# Patient Record
Sex: Female | Born: 1970 | Race: White | Hispanic: Yes | Marital: Single | Smoking: Never smoker
Health system: Southern US, Community
[De-identification: ages and names within clinical notes are randomized; demographics above are authoritative.]

## PROBLEM LIST (undated history)

## (undated) DIAGNOSIS — R059 Cough, unspecified: Secondary | ICD-10-CM

## (undated) DIAGNOSIS — Z9889 Other specified postprocedural states: Secondary | ICD-10-CM

## (undated) DIAGNOSIS — Z87442 Personal history of urinary calculi: Secondary | ICD-10-CM

## (undated) DIAGNOSIS — R05 Cough: Secondary | ICD-10-CM

## (undated) DIAGNOSIS — D649 Anemia, unspecified: Secondary | ICD-10-CM

## (undated) DIAGNOSIS — R112 Nausea with vomiting, unspecified: Secondary | ICD-10-CM

## (undated) DIAGNOSIS — I10 Essential (primary) hypertension: Secondary | ICD-10-CM

## (undated) HISTORY — PX: LEEP: SHX91

## (undated) HISTORY — PX: WISDOM TOOTH EXTRACTION: SHX21

---

## 2001-09-07 DIAGNOSIS — Z87442 Personal history of urinary calculi: Secondary | ICD-10-CM

## 2001-09-07 HISTORY — DX: Personal history of urinary calculi: Z87.442

## 2006-04-11 ENCOUNTER — Emergency Department (HOSPITAL_COMMUNITY): Admission: EM | Admit: 2006-04-11 | Discharge: 2006-04-11 | Payer: Self-pay | Admitting: Emergency Medicine

## 2006-04-15 ENCOUNTER — Ambulatory Visit (HOSPITAL_COMMUNITY): Admission: RE | Admit: 2006-04-15 | Discharge: 2006-04-15 | Payer: Self-pay | Admitting: Obstetrics and Gynecology

## 2008-08-13 ENCOUNTER — Emergency Department (HOSPITAL_COMMUNITY): Admission: EM | Admit: 2008-08-13 | Discharge: 2008-08-13 | Payer: Self-pay | Admitting: Emergency Medicine

## 2008-11-05 ENCOUNTER — Emergency Department (HOSPITAL_COMMUNITY): Admission: EM | Admit: 2008-11-05 | Discharge: 2008-11-05 | Payer: Self-pay | Admitting: *Deleted

## 2009-03-25 IMAGING — CT CT PELVIS W/O CM
2 of 4 series · 16 of 46 positions shown, 18 images · non-contrast
Comparison: None.

CT ABDOMEN

CLINICAL DATA: Pain with urination.  Right flank pain.  History of
stones.

CT ABDOMEN AND PELVIS WITHOUT CONTRAST
TECHNIQUE: Multidetector CT imaging of the abdomen and pelvis was
performed following the standard protocol without intravenous
contrast.

[Series 2: 160 stone 5.0 b40f · axial · 0.66mm/px · z∈[-410,-90]mm · 13 of 94 slices shown, 15 images]
[im 7/94  soft-tissue]
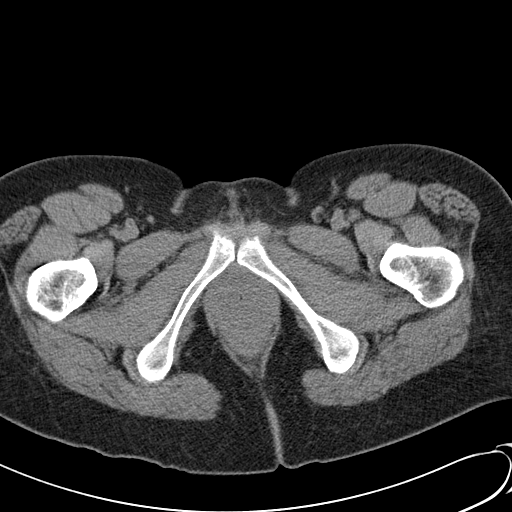
[im 7/94  bone]
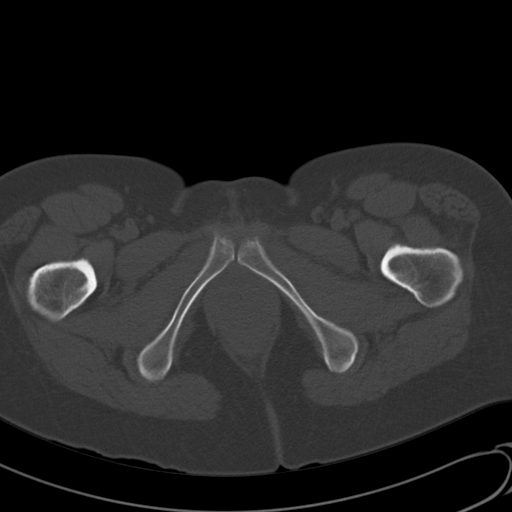
[im 14/94  soft-tissue]
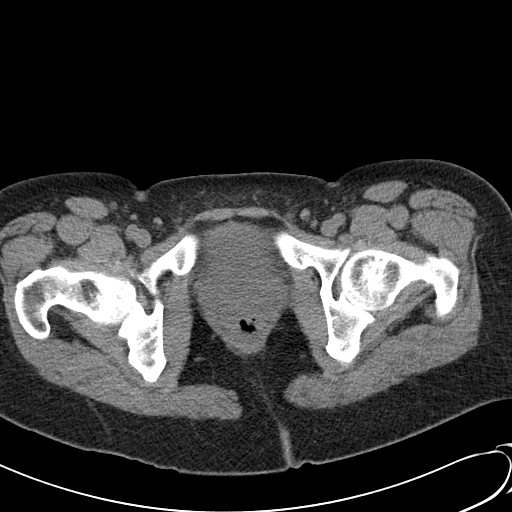
[im 20/94  soft-tissue]
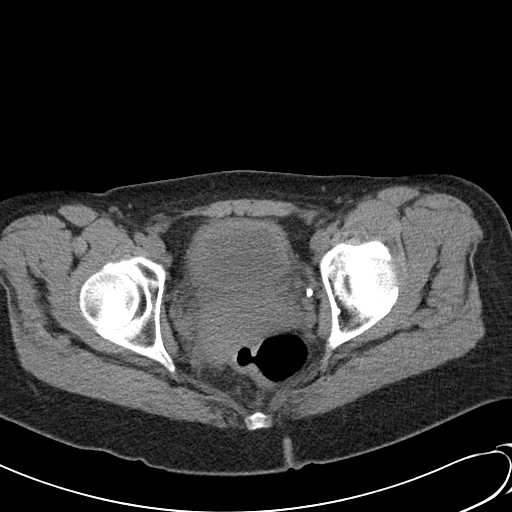
[im 27/94  soft-tissue]
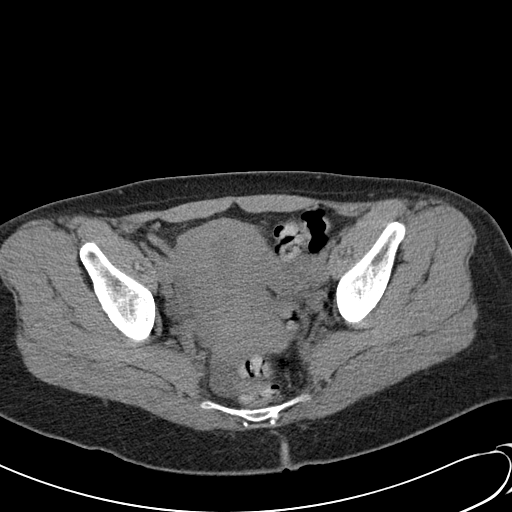
[im 34/94  soft-tissue]
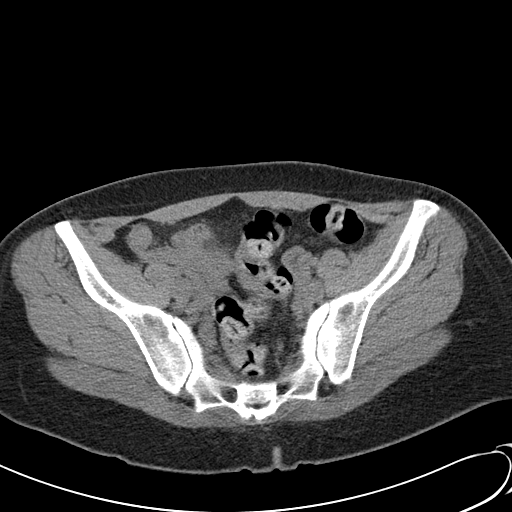
[im 40/94  soft-tissue]
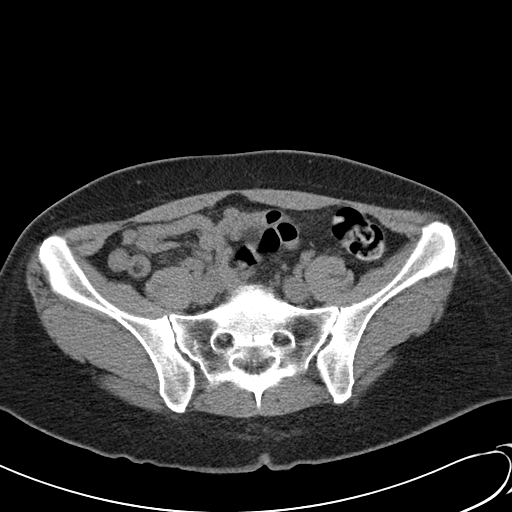
[im 47/94  soft-tissue]
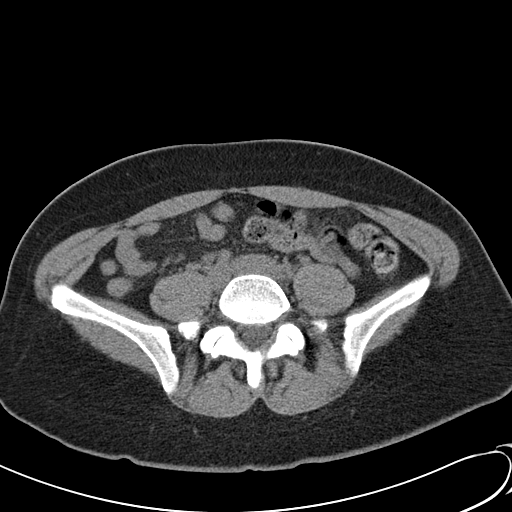
[im 54/94  soft-tissue]
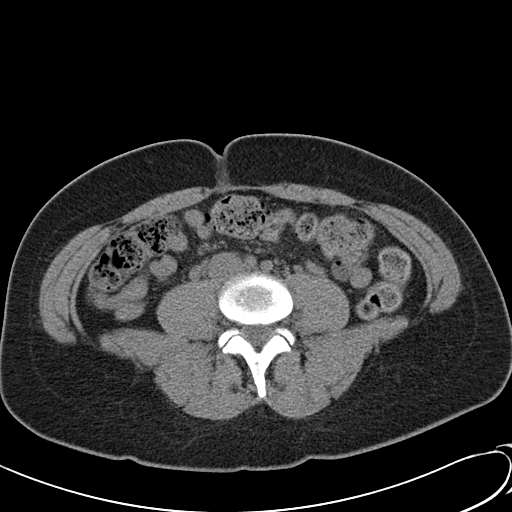
[im 60/94  soft-tissue]
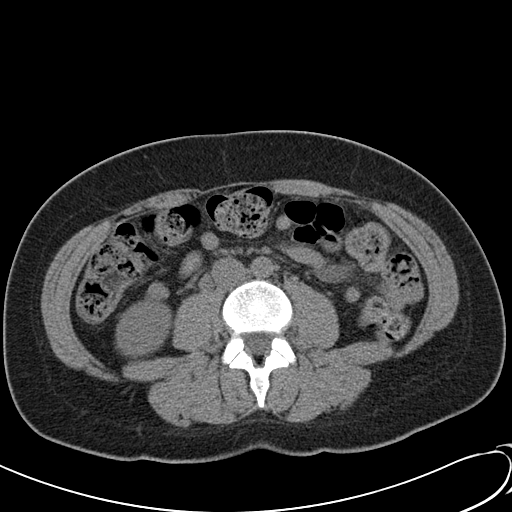
[im 60/94  bone]
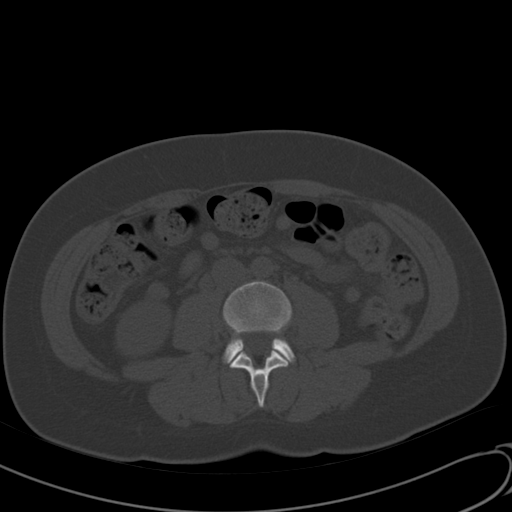
[im 67/94  soft-tissue]
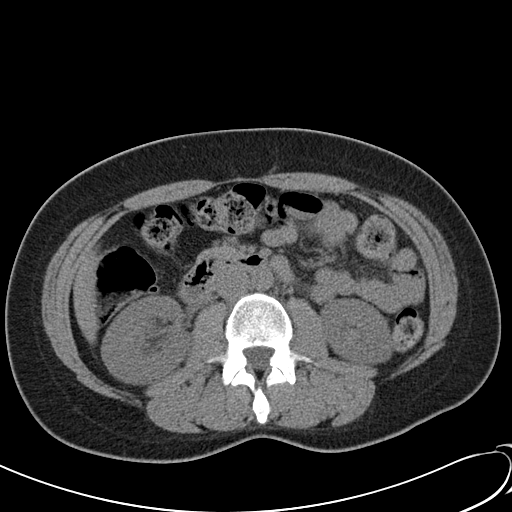
[im 74/94  soft-tissue]
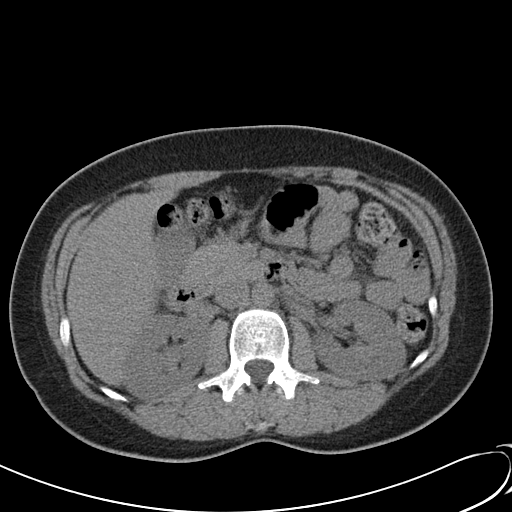
[im 80/94  soft-tissue]
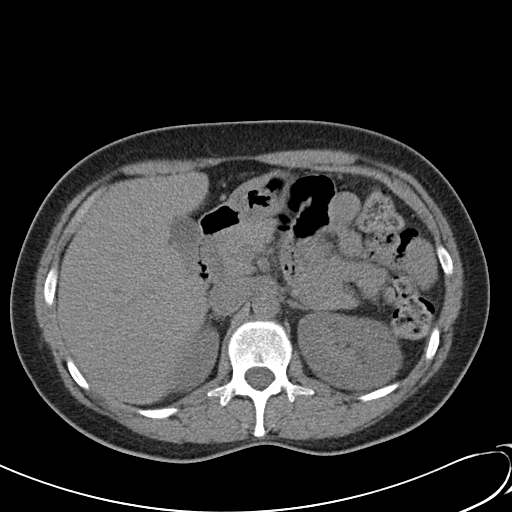
[im 87/94  soft-tissue]
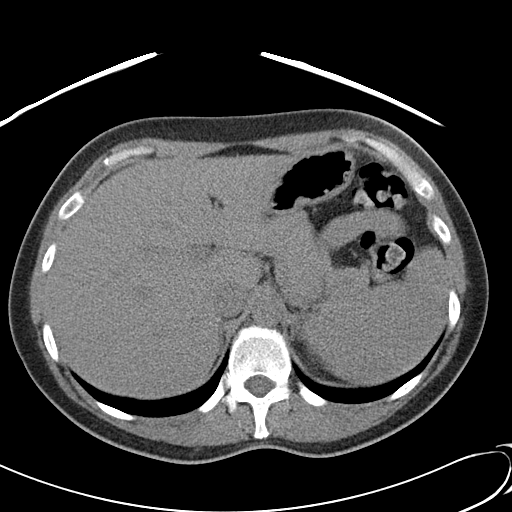

[Series 602: <mpr thick range> · coronal · 0.74mm/px · 3 of 69 slices shown]
[im 23/69  soft-tissue]
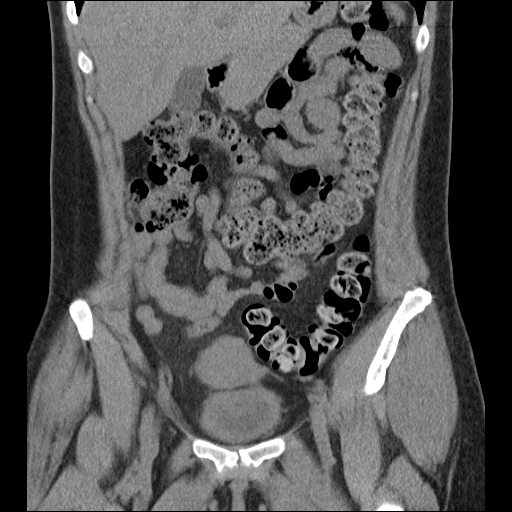
[im 31/69  soft-tissue]
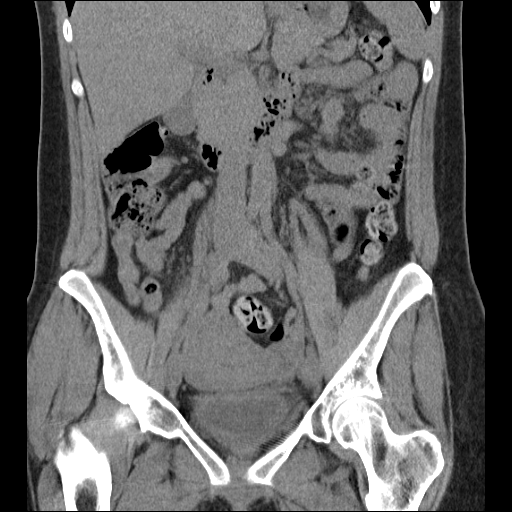
[im 38/69  soft-tissue]
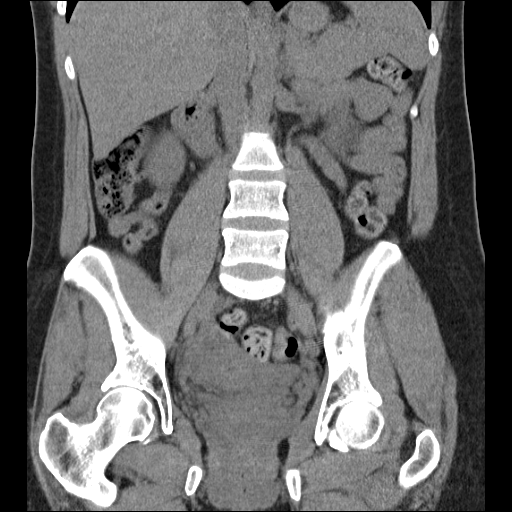

[16 of 46 positions shown; findings below may reference images not displayed]

FINDINGS: Lung bases clear.  Unenhanced visualized portions of the
liver and spleen unremarkable.

There is slight fullness of the right renal collecting system
without calcified obstructing stone identified.  Within lower pole
of the right kidney is a 4.7 mm hyperdensity which may represent a
renal calculus although a hyperdense primary renal lesion cannot be
excluded.  Stability can be confirmed on follow-up.  Nonobstructing
left renal calculi noted.  Adrenal glands unremarkable.  No
calcified gallstones.

Evaluation of bowel and pancreas is limited by lack of contrast and
fat planes without gross abnormality identified.  Of note is
superiorly located cecum with the appendix extending to just below
the liver without surrounding inflammation.  No abdominal aortic
aneurysm.
IMPRESSION: Slight fullness of the right renal collecting system without
radiopaque stone identified.  It is possible this is related to a
recently passed stone or nonradiopaque obstructing process.  Within
the lower pole of the right kidney is a radiopaque 4.7 mm structure
which may represent a stone or hyperdense lesion.  Stability can be
confirmed on follow-up.

Nonobstructing left renal calculi.

Superiorly located cecum and appendix without surrounding
inflammation.

CT PELVIS
FINDINGS: No ureteral calculi identified.  Partially decompressed
bladder with circumferentially thickened wall.  Small amount of
fluid within the right aspect of the pelvis may be related to
recent ovulation rather than bowel inflammation.
IMPRESSION: No ureteral calculi identified.

Small amount of fluid within the right aspect [DATE] be
related to recent ovulation rather than bowel inflammation.

## 2010-09-28 ENCOUNTER — Encounter: Payer: Self-pay | Admitting: Obstetrics and Gynecology

## 2010-12-18 LAB — URINALYSIS, ROUTINE W REFLEX MICROSCOPIC
Bilirubin Urine: NEGATIVE
Hgb urine dipstick: NEGATIVE
Specific Gravity, Urine: 1.024 (ref 1.005–1.030)
pH: 6 (ref 5.0–8.0)

## 2010-12-18 LAB — CBC
HCT: 33.7 % — ABNORMAL LOW (ref 36.0–46.0)
Hemoglobin: 11.1 g/dL — ABNORMAL LOW (ref 12.0–15.0)
MCV: 76.2 fL — ABNORMAL LOW (ref 78.0–100.0)
WBC: 3.8 10*3/uL — ABNORMAL LOW (ref 4.0–10.5)

## 2010-12-18 LAB — POCT PREGNANCY, URINE: Preg Test, Ur: NEGATIVE

## 2010-12-18 LAB — DIFFERENTIAL
Blasts: 0 %
Lymphocytes Relative: 35 % (ref 12–46)
Lymphs Abs: 1.3 10*3/uL (ref 0.7–4.0)
Myelocytes: 0 %
Neutro Abs: 2.1 10*3/uL (ref 1.7–7.7)
Promyelocytes Absolute: 0 %
nRBC: 0 /100 WBC

## 2010-12-18 LAB — POCT I-STAT, CHEM 8
BUN: 11 mg/dL (ref 6–23)
Creatinine, Ser: 0.9 mg/dL (ref 0.4–1.2)
Hemoglobin: 11.9 g/dL — ABNORMAL LOW (ref 12.0–15.0)
Potassium: 4 mEq/L (ref 3.5–5.1)
Sodium: 139 mEq/L (ref 135–145)

## 2011-01-09 ENCOUNTER — Emergency Department (HOSPITAL_COMMUNITY): Payer: Private Health Insurance - Indemnity

## 2011-01-09 ENCOUNTER — Emergency Department (HOSPITAL_COMMUNITY)
Admission: EM | Admit: 2011-01-09 | Discharge: 2011-01-09 | Disposition: A | Discharge status: Home / Self Care | Payer: Private Health Insurance - Indemnity | Attending: Emergency Medicine | Admitting: Emergency Medicine

## 2011-01-09 DIAGNOSIS — D649 Anemia, unspecified: Secondary | ICD-10-CM | POA: Insufficient documentation

## 2011-01-09 DIAGNOSIS — I10 Essential (primary) hypertension: Secondary | ICD-10-CM | POA: Insufficient documentation

## 2011-01-09 DIAGNOSIS — R0789 Other chest pain: Secondary | ICD-10-CM | POA: Insufficient documentation

## 2011-01-09 LAB — BASIC METABOLIC PANEL
CO2: 28 mEq/L (ref 19–32)
Calcium: 9.4 mg/dL (ref 8.4–10.5)
Creatinine, Ser: 0.6 mg/dL (ref 0.4–1.2)
GFR calc Af Amer: >60 mL/min (ref >60)
GFR calc non Af Amer: >60 mL/min (ref >60)
Sodium: 137 mEq/L (ref 135–145)

## 2011-01-09 LAB — POCT CARDIAC MARKERS
CKMB, poc: <1 ng/mL — ABNORMAL LOW (ref 1.0–8.0)
Myoglobin, poc: 57.5 ng/mL (ref 12–200)
Troponin i, poc: <0.05 ng/mL (ref 0.00–0.09)

## 2011-01-09 LAB — D-DIMER, QUANTITATIVE: D-Dimer, Quant: 0.46 ug/mL-FEU (ref 0.00–0.48)

## 2011-01-09 LAB — DIFFERENTIAL
Basophils Absolute: 0 10*3/uL (ref 0.0–0.1)
Basophils Relative: 1 % (ref 0–1)
Lymphocytes Relative: 32 % (ref 12–46)
Monocytes Absolute: 1.2 10*3/uL — ABNORMAL HIGH (ref 0.1–1.0)
Neutro Abs: 3.1 10*3/uL (ref 1.7–7.7)
Neutrophils Relative %: 48 % (ref 43–77)

## 2011-01-09 LAB — CBC
HCT: 34.1 % — ABNORMAL LOW (ref 36.0–46.0)
Hemoglobin: 10.9 g/dL — ABNORMAL LOW (ref 12.0–15.0)
MCHC: 32 g/dL (ref 30.0–36.0)
RBC: 4.53 MIL/uL (ref 3.87–5.11)

## 2011-01-09 LAB — HEPATIC FUNCTION PANEL
Albumin: 3.7 g/dL (ref 3.5–5.2)
Alkaline Phosphatase: 67 U/L (ref 39–117)
Indirect Bilirubin: 0.3 mg/dL (ref 0.3–0.9)
Total Protein: 7.4 g/dL (ref 6.0–8.3)

## 2011-06-12 LAB — URINALYSIS, ROUTINE W REFLEX MICROSCOPIC
Glucose, UA: NEGATIVE mg/dL
Ketones, ur: NEGATIVE mg/dL
Nitrite: POSITIVE — AB
Specific Gravity, Urine: 1.021 (ref 1.005–1.030)
pH: 6 (ref 5.0–8.0)

## 2011-06-12 LAB — URINE MICROSCOPIC-ADD ON

## 2011-09-23 ENCOUNTER — Emergency Department (HOSPITAL_COMMUNITY)
Admission: EM | Admit: 2011-09-23 | Discharge: 2011-09-23 | Disposition: A | Discharge status: Home / Self Care | Payer: Private Health Insurance - Indemnity | Attending: Emergency Medicine | Admitting: Emergency Medicine

## 2011-09-23 ENCOUNTER — Encounter (HOSPITAL_COMMUNITY): Payer: Self-pay | Admitting: *Deleted

## 2011-09-23 DIAGNOSIS — R42 Dizziness and giddiness: Secondary | ICD-10-CM | POA: Insufficient documentation

## 2011-09-23 DIAGNOSIS — I10 Essential (primary) hypertension: Secondary | ICD-10-CM | POA: Insufficient documentation

## 2011-09-23 DIAGNOSIS — Z79899 Other long term (current) drug therapy: Secondary | ICD-10-CM | POA: Insufficient documentation

## 2011-09-23 DIAGNOSIS — R55 Syncope and collapse: Secondary | ICD-10-CM | POA: Insufficient documentation

## 2011-09-23 NOTE — ED Provider Notes (Signed)
History     CSN: [id number]  Arrival date & time 09/23/11  1757   First MD Initiated Contact with Patient 09/23/11 2017      Chief Complaint  Patient presents with  . Medication Reaction    [facility] and being started on HCTZ due to BP being borderline. pt states since starting medication she has been experiencing dizziness, SOB, and HA.     (Consider location/radiation/quality/duration/timing/severity/associated sxs/prior treatment) HPI Meleni Delahunt is a 41 y.o. female presents with c/o dizziness and near-syncope leading to desire to be assessed in the ED. The sx(s) have been present for 3 days. Additional concerns are frequent urination. Causative factors are starting on HCTZ 3 days ago. Palliative factors are rest. The distress associated is mild. The disorder has been present for 3 days. History reviewed. No pertinent past medical history.  History reviewed. No pertinent past surgical history.  History reviewed. No pertinent family history.  History  Substance Use Topics  . Smoking status: Never Smoker   . Smokeless tobacco: Not on file  . Alcohol Use: No    OB History    Grav Para Term Preterm Abortions TAB SAB Ect Mult Living                  Review of Systems  All other systems reviewed and are negative.    Allergies  Review of patient's allergies indicates no known allergies.  Home Medications   Current Outpatient Rx  Name Route Sig Dispense Refill  . ACETAMINOPHEN 325 MG PO TABS Oral Take 650 mg by mouth every 6 (six) hours as needed.    Marland Kitchen HYDROCHLOROTHIAZIDE 25 MG PO TABS Oral Take 25 mg by mouth daily.    . IBANDRONATE SODIUM 150 MG PO TABS Oral Take 150 mg by mouth every 30 (thirty) days. Take in the morning with a full glass of water, on an empty stomach, and do not take anything else by mouth or lie down for the next 30 min.    . IBUPROFEN 200 MG PO TABS Oral Take 400 mg by mouth every 6 (six) hours as needed.    Marland Kitchen  METRONIDAZOLE 500 MG PO TABS Oral Take 500 mg by mouth 2 (two) times daily.      BP 138/102  Pulse 87  Temp(Src) 98.4 F (36.9 C) (Oral)  Resp 20  SpO2 100%  LMP 09/07/2011  Physical Exam  Nursing note and vitals reviewed. Constitutional: She is oriented to person, place, and time. She appears well-developed and well-nourished.  HENT:  Head: Normocephalic and atraumatic.  Eyes: Conjunctivae and EOM are normal. Pupils are equal, round, and reactive to light.  Neck: Normal range of motion and phonation normal. Neck supple.  Cardiovascular: Normal rate, regular rhythm and intact distal pulses.   Pulmonary/Chest: Effort normal and breath sounds normal. She exhibits no tenderness.  Abdominal: Soft.  Musculoskeletal: Normal range of motion.  Neurological: She is alert and oriented to person, place, and time. She has normal strength and normal reflexes. She exhibits normal muscle tone.       Normal gait  Skin: Skin is warm and dry.  Psychiatric: She has a normal mood and affect. Her behavior is normal. Judgment and thought content normal.    ED Course  Procedures (including critical care time)  Labs Reviewed - No data to display No results found.   1. Dizziness   2. Hypertension       MDM  Expected diuretic effect from starting HCTZ.  Mild HTN without concern for hypertensive crises.  Plan: decrease dose to 12.5 mg for 3 days, then resume 25 mg, and f/u with PCP in 4-5 days.        Flint Melter, MD 09/24/11 [phone]

## 2016-01-12 ENCOUNTER — Encounter (HOSPITAL_COMMUNITY): Payer: Self-pay | Admitting: Family Medicine

## 2016-01-12 ENCOUNTER — Emergency Department (HOSPITAL_COMMUNITY)
Admission: EM | Admit: 2016-01-12 | Discharge: 2016-01-12 | Disposition: A | Discharge status: Home / Self Care | Payer: 59 | Attending: Emergency Medicine | Admitting: Emergency Medicine

## 2016-01-12 DIAGNOSIS — T7840XA Allergy, unspecified, initial encounter: Secondary | ICD-10-CM

## 2016-01-12 DIAGNOSIS — Z79899 Other long term (current) drug therapy: Secondary | ICD-10-CM | POA: Insufficient documentation

## 2016-01-12 DIAGNOSIS — L500 Allergic urticaria: Secondary | ICD-10-CM | POA: Diagnosis not present

## 2016-01-12 DIAGNOSIS — Z792 Long term (current) use of antibiotics: Secondary | ICD-10-CM | POA: Insufficient documentation

## 2016-01-12 DIAGNOSIS — Z3202 Encounter for pregnancy test, result negative: Secondary | ICD-10-CM | POA: Diagnosis not present

## 2016-01-12 DIAGNOSIS — R350 Frequency of micturition: Secondary | ICD-10-CM | POA: Insufficient documentation

## 2016-01-12 DIAGNOSIS — R109 Unspecified abdominal pain: Secondary | ICD-10-CM | POA: Insufficient documentation

## 2016-01-12 DIAGNOSIS — T368X5A Adverse effect of other systemic antibiotics, initial encounter: Secondary | ICD-10-CM | POA: Diagnosis not present

## 2016-01-12 DIAGNOSIS — L509 Urticaria, unspecified: Secondary | ICD-10-CM

## 2016-01-12 LAB — URINALYSIS, ROUTINE W REFLEX MICROSCOPIC
Bilirubin Urine: NEGATIVE
Glucose, UA: NEGATIVE mg/dL
HGB URINE DIPSTICK: NEGATIVE
Ketones, ur: NEGATIVE mg/dL
Leukocytes, UA: NEGATIVE
NITRITE: NEGATIVE
PROTEIN: NEGATIVE mg/dL
SPECIFIC GRAVITY, URINE: 1.006 (ref 1.005–1.030)
pH: 6.5 (ref 5.0–8.0)

## 2016-01-12 LAB — POC URINE PREG, ED: Preg Test, Ur: NEGATIVE

## 2016-01-12 MED ORDER — PREDNISONE 20 MG PO TABS
60.0000 mg | ORAL_TABLET | Freq: Once | ORAL | Status: AC
  Administered 2016-01-12: 60 mg via ORAL
  Filled 2016-01-12: qty 3

## 2016-01-12 MED ORDER — DIPHENHYDRAMINE HCL 25 MG PO CAPS: 25.0000 mg | ORAL_CAPSULE | Freq: Four times a day (QID) | ORAL | Status: DC | PRN

## 2016-01-12 MED ORDER — PREDNISONE 20 MG PO TABS: 60.0000 mg | ORAL_TABLET | Freq: Every day | ORAL | Status: DC

## 2016-01-12 MED ORDER — DIPHENHYDRAMINE HCL 25 MG PO CAPS
25.0000 mg | ORAL_CAPSULE | Freq: Once | ORAL | Status: AC
  Administered 2016-01-12: 25 mg via ORAL
  Filled 2016-01-12: qty 1

## 2016-01-12 NOTE — Discharge Instructions (Signed)
You were treated for an allergic reaction to Ciprofloxacin with Benadryl and Prednisone (a steroid). Please continue taking the prednisone for 3 days and Benadryl as needed every 4-6 hours for itching. Please follow-up with your primary care physician.

## 2016-01-12 NOTE — ED Notes (Signed)
Pt here for possible allergic reaction to Cipro. Recently started for possible urian tract infection. sts rash, lips swelling and throat swelling.

## 2016-01-12 NOTE — ED Provider Notes (Signed)
CSN: [id number]     Arrival date & time 01/12/16  1125 History   First MD Initiated Contact with Patient 01/12/16 1139     Chief Complaint  Patient presents with  . Allergic Reaction     (Consider location/radiation/quality/duration/timing/severity/associated sxs/prior Treatment) Patient is a 45 y.o. female presenting with allergic reaction and flank pain. The history is provided by the patient.  Allergic Reaction Presenting symptoms: itching, rash and swelling   Presenting symptoms: no difficulty breathing, no difficulty swallowing and no wheezing   Itching:    Location:  Foot, leg and buttocks   Severity:  Moderate   Onset quality:  Sudden   Duration:  1 day   Progression:  Unchanged Rash:    Location:  Buttocks, leg and foot   Quality: itchiness     Severity:  Moderate   Onset quality:  Sudden   Duration:  1 day   Progression:  Unchanged Severity:  Mild Prior allergic episodes:  No prior episodes Context: medications (Ciprofloxacin)   Relieved by:  None tried Worsened by:  Nothing tried Ineffective treatments:  None tried Flank Pain This is a new problem. The current episode started 1 to 4 weeks ago (1.5 weeks). The problem occurs intermittently. The problem has been unchanged. Associated symptoms include a rash and urinary symptoms (frequency). Pertinent negatives include no abdominal pain, nausea or vomiting. Nothing aggravates the symptoms. Treatments tried: antibiotics. The treatment provided no relief.    History reviewed. No pertinent past medical history. History reviewed. No pertinent past surgical history. No family history on file. Social History  Substance Use Topics  . Smoking status: Never Smoker   . Smokeless tobacco: None  . Alcohol Use: No   OB History    No data available     Review of Systems  HENT: Negative for trouble swallowing.   Respiratory: Negative for wheezing.   Gastrointestinal: Negative for nausea, vomiting and abdominal pain.   Genitourinary: Positive for flank pain.  Skin: Positive for itching and rash.  All other systems reviewed and are negative.    Allergies  Ciprofloxacin  Home Medications   Prior to Admission medications   Medication Sig Start Date End Date Taking? Authorizing Provider  acetaminophen (TYLENOL) 325 MG tablet Take 650 mg by mouth every 6 (six) hours as needed.    Historical Provider, MD  diphenhydrAMINE (BENADRYL) 25 mg capsule Take 1 capsule (25 mg total) by mouth every 6 (six) hours as needed. 01/12/16   Narda Bonds, MD  hydrochlorothiazide (HYDRODIURIL) 25 MG tablet Take 25 mg by mouth daily.    Historical Provider, MD  ibandronate (BONIVA) 150 MG tablet Take 150 mg by mouth every 30 (thirty) days. Take in the morning with a full glass of water, on an empty stomach, and do not take anything else by mouth or lie down for the next 30 min.    Historical Provider, MD  ibuprofen (ADVIL,MOTRIN) 200 MG tablet Take 400 mg by mouth every 6 (six) hours as needed.    Historical Provider, MD  metroNIDAZOLE (FLAGYL) 500 MG tablet Take 500 mg by mouth 2 (two) times daily.    Historical Provider, MD  predniSONE (DELTASONE) 20 MG tablet Take 3 tablets (60 mg total) by mouth daily with breakfast. 01/12/16   Narda Bonds, MD   BP 137/90 mmHg  Pulse 68  Temp(Src) 97.8 F (36.6 C) (Oral)  Resp 16  SpO2 99%  LMP 01/06/2016 Physical Exam  Constitutional: She is oriented to person, place,  and time. She appears well-developed and well-nourished.  Eyes: Conjunctivae and EOM are normal. Pupils are equal, round, and reactive to light.  Neck: Normal range of motion.  Cardiovascular: Normal rate and regular rhythm.   No murmur heard. Pulmonary/Chest: Effort normal and breath sounds normal. No respiratory distress. She has no wheezes. She has no rales.  Abdominal: Soft. Bowel sounds are normal. She exhibits no distension. There is no tenderness. There is no rebound, no guarding and no CVA tenderness.   Musculoskeletal: Normal range of motion. She exhibits no edema or tenderness.       Lumbar back: She exhibits normal range of motion, no tenderness and no swelling.  Neurological: She is alert and oriented to person, place, and time.  Skin: Skin is warm and dry. Rash (multiple erythematous wheals on bilateral buttocks, hips and one lesion on right foot) noted.    ED Course  Procedures (including critical care time) Labs Review Labs Reviewed  URINALYSIS, ROUTINE W REFLEX MICROSCOPIC (NOT AT [facility])  POC URINE PREG, ED   Urinalysis    Component Value Date/Time   COLORURINE YELLOW 01/12/2016 1307   APPEARANCEUR CLEAR 01/12/2016 1307   LABSPEC 1.006 01/12/2016 1307   PHURINE 6.5 01/12/2016 1307   GLUCOSEU NEGATIVE 01/12/2016 1307   HGBUR NEGATIVE 01/12/2016 1307   BILIRUBINUR NEGATIVE 01/12/2016 1307   KETONESUR NEGATIVE 01/12/2016 1307   PROTEINUR NEGATIVE 01/12/2016 1307   UROBILINOGEN 0.2 11/05/2008 0212   NITRITE NEGATIVE 01/12/2016 1307   LEUKOCYTESUR NEGATIVE 01/12/2016 1307    Imaging Review No results found. I have personally reviewed and evaluated these images and lab results as part of my medical decision-making.   EKG Interpretation None      MDM   Final diagnoses:  Allergic reaction caused by a drug  Urticaria  Frequency of urination   Patient presents with allergic reaction to ciprofloxacin involving mucosal edema. No wheezing, vomiting/diarrhea. Patient's symptoms mostly improved by the time she arrived in the ED, minus her rash and itching. Given Benadryl and Prednisone and will be sent home with a course of four days total treatment. Urinalysis not suggestive of an infection and, via care everywhere, urine culture obtained a few days ago is no growth x48 hours. Discussed results with patient and agrees to follow-up with PCP. Patient's vitals and clinical presentation stable for discharge home.    Narda Bondsalph A Keante Urizar, MD 01/12/16 1441  Zadie Rhineonald Wickline,  MD 01/12/16 [phone]

## 2016-09-26 ENCOUNTER — Emergency Department (HOSPITAL_COMMUNITY)
Admission: EM | Admit: 2016-09-26 | Discharge: 2016-09-27 | Disposition: A | Discharge status: Home / Self Care | Payer: 59 | Attending: Emergency Medicine | Admitting: Emergency Medicine

## 2016-09-26 ENCOUNTER — Encounter (HOSPITAL_COMMUNITY): Payer: Self-pay | Admitting: Emergency Medicine

## 2016-09-26 DIAGNOSIS — N939 Abnormal uterine and vaginal bleeding, unspecified: Secondary | ICD-10-CM | POA: Diagnosis not present

## 2016-09-26 DIAGNOSIS — Z79899 Other long term (current) drug therapy: Secondary | ICD-10-CM | POA: Diagnosis not present

## 2016-09-26 DIAGNOSIS — R58 Hemorrhage, not elsewhere classified: Secondary | ICD-10-CM

## 2016-09-26 LAB — CBC WITH DIFFERENTIAL/PLATELET
BASOS ABS: 0 10*3/uL (ref 0.0–0.1)
Basophils Relative: 0 %
EOS ABS: 0.1 10*3/uL (ref 0.0–0.7)
Eosinophils Relative: 1 %
HCT: 35.4 % — ABNORMAL LOW (ref 36.0–46.0)
HEMOGLOBIN: 11.1 g/dL — AB (ref 12.0–15.0)
LYMPHS ABS: 2.9 10*3/uL (ref 0.7–4.0)
Lymphocytes Relative: 36 %
MCH: 24.2 pg — ABNORMAL LOW (ref 26.0–34.0)
MCHC: 31.4 g/dL (ref 30.0–36.0)
MCV: 77.1 fL — ABNORMAL LOW (ref 78.0–100.0)
Monocytes Absolute: 0.7 10*3/uL (ref 0.1–1.0)
Monocytes Relative: 9 %
NEUTROS PCT: 54 %
Neutro Abs: 4.2 10*3/uL (ref 1.7–7.7)
Platelets: 295 10*3/uL (ref 150–400)
RBC: 4.59 MIL/uL (ref 3.87–5.11)
RDW: 16.6 % — ABNORMAL HIGH (ref 11.5–15.5)
WBC: 7.9 10*3/uL (ref 4.0–10.5)

## 2016-09-26 LAB — BASIC METABOLIC PANEL
Anion gap: 11 (ref 5–15)
BUN: 14 mg/dL (ref 6–20)
CHLORIDE: 102 mmol/L (ref 101–111)
CO2: 23 mmol/L (ref 22–32)
Calcium: 8.8 mg/dL — ABNORMAL LOW (ref 8.9–10.3)
Creatinine, Ser: 0.7 mg/dL (ref 0.44–1.00)
GFR calc non Af Amer: >60 mL/min (ref >60)
Glucose, Bld: 102 mg/dL — ABNORMAL HIGH (ref 65–99)
Potassium: 3.6 mmol/L (ref 3.5–5.1)
SODIUM: 136 mmol/L (ref 135–145)

## 2016-09-26 LAB — SAMPLE TO BLOOD BANK

## 2016-09-26 LAB — I-STAT BETA HCG BLOOD, ED (MC, WL, AP ONLY): HCG, QUANTITATIVE: 80.6 m[IU]/mL — AB (ref <5)

## 2016-09-26 NOTE — ED Notes (Signed)
Pt is aware she needs a urine sample but due to bleeding she does not think she can at this moment

## 2016-09-26 NOTE — ED Triage Notes (Signed)
Patient reports vaginal bleeding onset this morning , she stated she had an  abortion 2 weeks ago , denies fever or chills . No abdominal cramping .

## 2016-09-27 ENCOUNTER — Emergency Department (HOSPITAL_COMMUNITY): Payer: 59

## 2016-09-27 NOTE — ED Notes (Signed)
Pt departed before discharge instructions completed. When asked to remain, pt expressed frustration at having to wait and stated we "haven't done anything" for her. Reminded pt of US and blood work, and that all we had potentially been waiting on was a urine specimen. Pt continued to walk out, departing in NAD.

## 2016-09-27 NOTE — ED Provider Notes (Signed)
[facility]ERGENCY DEPT Provider Note   CSN: [id number] Arrival date & time: 09/26/16  2035     History   Chief Complaint Chief Complaint  Patient presents with  . Vaginal Bleeding    HPI Katherine Hart is a 46 y.o. female.  HPI   Katherine Hart is a 46 y.o. female, with a history of abortion, presenting to the ED with vaginal bleeding. Pt states she had a chemical abortion at the [facility] in [place] on Jan 5. Pt had bleeding afterward that tapered off, but then this morning around 9 am her bleeding began to increase and she has been going through about 3 pads an hour. Endorses abdominal cramping that she rates 7/10.  Denies fever/chills, N/V, lightheadedness/dizziness, or any other complaints.   [id number]. OBGYN is Dr. Mikey Bussing. She has not contacted her OBGYN. She has a standard follow up with [facility] on Jan 22.     Past Medical History:  Diagnosis Date  . Abortion     There are no active problems to display for this patient.   History reviewed. No pertinent surgical history.  OB History    No data available       Home Medications    Prior to Admission medications   Medication Sig Start Date End Date Taking? Authorizing Provider  acetaminophen (TYLENOL) 325 MG tablet Take 650 mg by mouth every 6 (six) hours as needed.    Historical Provider, MD  diphenhydrAMINE (BENADRYL) 25 mg capsule Take 1 capsule (25 mg total) by mouth every 6 (six) hours as needed. 01/12/16   Narda Bonds, MD  hydrochlorothiazide (HYDRODIURIL) 25 MG tablet Take 25 mg by mouth daily.    Historical Provider, MD  ibandronate (BONIVA) 150 MG tablet Take 150 mg by mouth every 30 (thirty) days. Take in the morning with a full glass of water, on an empty stomach, and do not take anything else by mouth or lie down for the next 30 min.    Historical Provider, MD  ibuprofen (ADVIL,MOTRIN) 200 MG tablet Take 400 mg by mouth every 6 (six) hours as needed.    Historical Provider, MD    metroNIDAZOLE (FLAGYL) 500 MG tablet Take 500 mg by mouth 2 (two) times daily.    Historical Provider, MD  predniSONE (DELTASONE) 20 MG tablet Take 3 tablets (60 mg total) by mouth daily with breakfast. 01/12/16   Narda Bonds, MD    Family History No family history on file.  Social History Social History  Substance Use Topics  . Smoking status: Never Smoker  . Smokeless tobacco: Never Used  . Alcohol use No     Allergies   Ciprofloxacin   Review of Systems Review of Systems  Constitutional: Negative for chills and fever.  Respiratory: Negative for shortness of breath.   Gastrointestinal: Positive for abdominal pain (cramping). Negative for nausea and vomiting.  Genitourinary: Positive for vaginal bleeding.  All other systems reviewed and are negative.    Physical Exam Updated Vital Signs BP 137/89 (BP Location: Left Arm)   Pulse 77   Temp 98.2 F (36.8 C) (Oral)   Resp 16   LMP 07/18/2016   SpO2 100%   Physical Exam  Constitutional: She appears well-developed and well-nourished. No distress.  HENT:  Head: Normocephalic and atraumatic.  Eyes: Conjunctivae are normal.  Neck: Neck supple.  Cardiovascular: Normal rate, regular rhythm, normal heart sounds and intact distal pulses.   Pulmonary/Chest: Effort normal and breath sounds normal. No respiratory distress.  Abdominal: Soft. There is no tenderness. There is no guarding.  Genitourinary:  Genitourinary Comments: External genitalia normal Vagina with discharge - Moderate, bright red bleeding from the cervical os with clots present. Largest clots about the size of a quarter. Cervix  abnormal  - open os with blood trickling negative for cervical motion tenderness Adnexa palpated, no masses, negative for tenderness noted Bladder palpated negative for tenderness Uterus palpated no masses, negative for tenderness  Otherwise normal female genitalia. RN, [place], served as Biomedical engineerchaperone during exam.  Musculoskeletal:  She exhibits no edema.  Lymphadenopathy:    She has no cervical adenopathy.  Neurological: She is alert.  Skin: Skin is warm and dry. She is not diaphoretic.  Psychiatric: She has a normal mood and affect. Her behavior is normal.  Nursing note and vitals reviewed.    ED Treatments / Results  Labs (all labs ordered are listed, but only abnormal results are displayed) Labs Reviewed  CBC WITH DIFFERENTIAL/PLATELET - Abnormal; Notable for the following:       Result Value   Hemoglobin 11.1 (*)    HCT 35.4 (*)    MCV 77.1 (*)    MCH 24.2 (*)    RDW 16.6 (*)    All other components within normal limits  BASIC METABOLIC PANEL - Abnormal; Notable for the following:    Glucose, Bld 102 (*)    Calcium 8.8 (*)    All other components within normal limits  I-STAT BETA HCG BLOOD, ED ([facility], [facility], [facility] ONLY) - Abnormal; Notable for the following:    I-stat hCG, quantitative 80.6 (*)    All other components within normal limits  SAMPLE TO BLOOD BANK   Hemoglobin  Date Value Ref Range Status  09/26/2016 11.1 (L) 12.0 - 15.0 g/dL Final  16/10/960405/12/2010 54.010.9 (L) 12.0 - 15.0 g/dL Final  98/11/914703/09/2008 82.911.9 (L) 12.0 - 15.0 g/dL Final  56/21/308603/09/2008 57.811.1 (L) 12.0 - 15.0 g/dL Final   EKG  EKG Interpretation None       Radiology [place] Ob Limited  Result Date: 09/27/2016 CLINICAL DATA:  46 y/o F; abortion 2 weeks ago with increased bleeding 1 day ago. Beta HCG of 80.6. EXAM: OBSTETRIC <14 WK ULTRASOUND TECHNIQUE: Transabdominal ultrasound was performed for evaluation of the gestation as well as the maternal uterus and adnexal regions. COMPARISON:  None. FINDINGS: Intrauterine gestational sac: None Yolk sac:  Not Visualized. Embryo:  Not Visualized. Cardiac Activity: Not Visualized. Maternal uterus/adnexae: The uterus measures 10.1 x 5.5 x 5.4 cm and is normal. The endometrium is mildly heterogeneous measuring up to 15 mm. There is no abnormal blood flow on color Doppler the suggests retained products of conception  or vascular malformation. Normal right ovary with simple follicle measuring up to 1.8 cm. The left ovary is not identified IMPRESSION: 1. No intrauterine pregnancy is identified. Follow-up beta HCG and pelvic ultrasound as clinically indicated is recommended. 2. Mild heterogeneity of the endometrium is probably due to recent abortion. There is no abnormal blood flow on color Doppler to suggest retained products of conception or vascular malformation. Electronically Signed   By: Mitzi HansenLance  Furusawa-Stratton M.D.   On: 09/27/2016 03:24   [place] Ob Transvaginal  Result Date: 09/27/2016 CLINICAL DATA:  46 y/o F; abortion 2 weeks ago with increased bleeding 1 day ago. Beta HCG of 80.6. EXAM: OBSTETRIC <14 WK ULTRASOUND TECHNIQUE: Transabdominal ultrasound was performed for evaluation of the gestation as well as the maternal uterus and adnexal regions. COMPARISON:  None. FINDINGS: Intrauterine gestational  sac: None Yolk sac:  Not Visualized. Embryo:  Not Visualized. Cardiac Activity: Not Visualized. Maternal uterus/adnexae: The uterus measures 10.1 x 5.5 x 5.4 cm and is normal. The endometrium is mildly heterogeneous measuring up to 15 mm. There is no abnormal blood flow on color Doppler the suggests retained products of conception or vascular malformation. Normal right ovary with simple follicle measuring up to 1.8 cm. The left ovary is not identified IMPRESSION: 1. No intrauterine pregnancy is identified. Follow-up beta HCG and pelvic ultrasound as clinically indicated is recommended. 2. Mild heterogeneity of the endometrium is probably due to recent abortion. There is no abnormal blood flow on color Doppler to suggest retained products of conception or vascular malformation. Electronically Signed   By: Mitzi Hansen M.D.   On: 09/27/2016 03:24    Procedures Procedures (including critical care time)  Medications Ordered in ED Medications - No data to display   Initial Impression / Assessment and Plan /  ED Course  I have reviewed the triage vital signs and the nursing notes.  Pertinent labs & imaging results that were available during my care of the patient were reviewed by me and considered in my medical decision making (see chart for details).     Patient presents with vaginal bleeding, worsening today. No significant bleeding noted on exam. Patient is nontoxic appearing and hemodynamically stable. Hemoglobin is consistent with previous values. No retained products of conception or other unexpected abnormalities on ultrasound. She has appropriate follow-up in a little over 24 hours. These results and the remaining were explained to the patient as well as the fact that the findings are encouraging. Patient was upset and remained upset that we did not stop her vaginal bleeding this evening. Strict return precautions discussed. Patient voiced understanding of these instructions.     Findings and plan of care discussed with Tomasita Crumble, MD.   Vitals:   09/27/16 0015 09/27/16 0030 09/27/16 0045 09/27/16 0055  BP: 136/78  (!) 150/104 (!) 150/104  Pulse: 75 83 77 75  Resp:    22  Temp:      TempSrc:      SpO2: 100% 100% 100% 99%    Final Clinical Impressions(s) / ED Diagnoses   Final diagnoses:  Vaginal bleeding    New Prescriptions Discharge Medication List as of 09/27/2016  3:48 AM       Anselm Pancoast, PA-C 09/27/16 1610    Tomasita Crumble, MD 09/27/16 [id number]

## 2016-09-27 NOTE — Discharge Instructions (Signed)
There were no signs of severe blood loss on your lab results or vital signs. There were no retained products of conception on the ultrasound. Please follow up with [facility] on this issue for your already scheduled appointment on Jan 22. Should symptoms worsen or new symptoms develop, [facility] care.

## 2016-09-27 NOTE — ED Notes (Signed)
Attempted to obtain urine sample from pt pt states she "just cant go" RN aware

## 2017-11-18 ENCOUNTER — Encounter (HOSPITAL_BASED_OUTPATIENT_CLINIC_OR_DEPARTMENT_OTHER): Payer: Self-pay | Admitting: *Deleted

## 2017-11-18 ENCOUNTER — Other Ambulatory Visit: Payer: Self-pay | Admitting: Obstetrics and Gynecology

## 2017-11-22 ENCOUNTER — Other Ambulatory Visit: Payer: Self-pay

## 2017-11-22 ENCOUNTER — Encounter (HOSPITAL_BASED_OUTPATIENT_CLINIC_OR_DEPARTMENT_OTHER): Payer: Self-pay | Admitting: *Deleted

## 2017-11-22 NOTE — Progress Notes (Addendum)
Spoke with Shanda Bumpsjessica Npo after midnight arrive 730 am 09-27-89 [facility] meds to take none  Patient aware driver must be 18 or over and will arrange driver 18 or over for day of surgery Needs urine pregnancy ekg and istat 4. Spoke with adrian at dr haygood's office and case is booked correctly and left message for patient ablation is booked correctly.   Patient with cough with yellow phlegm x 3 days, patient advised to see primary md if fever develops or chest congestion develops.

## 2017-11-24 DIAGNOSIS — N920 Excessive and frequent menstruation with regular cycle: Secondary | ICD-10-CM | POA: Diagnosis present

## 2017-11-24 DIAGNOSIS — D500 Iron deficiency anemia secondary to blood loss (chronic): Secondary | ICD-10-CM

## 2017-11-24 DIAGNOSIS — N926 Irregular menstruation, unspecified: Secondary | ICD-10-CM | POA: Diagnosis present

## 2017-11-24 NOTE — H&P (Signed)
Katherine IvanoffJessica Hart is an 47 y.o. female. She presents with a 6 month hx of heavy menses with soiling of clothes and linens.    Pertinent Gynecological History: Menses: irregular occurring approximately every 30 days with spotting approximately 10 days per month Bleeding: intermenstrual bleeding Contraception: abstinence DES exposure: denies Blood transfusions: none Sexually transmitted diseases: no past history Previous GYN Procedures: LEEP 1995  Last mammogram: normal Date: 11/18/17 Last pap: normal Date: 2017 OB History: G5, P2   Menstrual History: Menarche age: [age] Patient's last menstrual period was 10/24/2017.    Past Medical History:  Diagnosis Date  . Anemia   . Cough    with yellow phlegm x 3 days  . History of kidney stones 2003  . Hypertension   . PONV (postoperative nausea and vomiting)     Past Surgical History:  Procedure Laterality Date  . LEEP  age 47  . WISDOM TOOTH EXTRACTION      History reviewed. No pertinent family history.  Social History:  reports that she has never smoked. She has never used smokeless tobacco. She reports that she does not drink alcohol or use drugs.  Allergies:  Allergies  Allergen Reactions  . Ciprofloxacin Swelling    Red blotches over entire body Lips swelling  . Clindamycin/Lincomycin     Diarrhea, red blothches over body lips swell    Medications Prior to Admission  Medication Sig Dispense Refill Last Dose  . acetaminophen (TYLENOL) 325 MG tablet Take 650 mg by mouth every 6 (six) hours as needed.   Past Week at Unknown time  . amLODipine (NORVASC) 5 MG tablet Take 5 mg by mouth daily.   11/24/2017 at Unknown time  . b complex vitamins capsule Take 1 capsule by mouth daily.   11/24/2017 at Unknown time  . Dextromethorphan-Guaifenesin (CORICIDIN HBP CONGESTION/COUGH PO) Take by mouth. prn   11/24/2017 at Unknown time  . medroxyPROGESTERone (PROVERA) 10 MG tablet Take 10 mg by mouth daily. 4 tabs per day   11/24/2017 at Unknown  time  . UNABLE TO FIND Vitamin d 2 1 per day   11/24/2017 at Unknown time    Review of Systems  Constitutional: Negative.   HENT: Negative.   Eyes: Negative.   Respiratory: Negative.   Cardiovascular: Negative.   Gastrointestinal: Negative.   Genitourinary: Negative.   Musculoskeletal: Negative.   Skin: Negative.   Neurological: Negative.   Endo/Heme/Allergies: Negative.   Psychiatric/Behavioral: The patient is nervous/anxious.     Blood pressure (!) 145/81, pulse 75, temperature 98.1 F (36.7 C), temperature source Oral, resp. rate 18, height 5\' 3"  (1.6 m), weight 215 lb 6.4 oz (97.7 kg), last menstrual period 10/24/2017, SpO2 100 %. Physical Exam  Constitutional: She is oriented to person, place, and time. She appears well-developed and well-nourished.  HENT:  Head: Normocephalic and atraumatic.  Eyes: EOM are normal.  Neck: Normal range of motion. Neck supple.  Cardiovascular: Normal rate and regular rhythm.  Respiratory: Effort normal and breath sounds normal.  GI: Soft. Bowel sounds are normal.  Genitourinary:  Genitourinary Comments: Pelvic exam: normal external genitalia, vulva, vagina, cervix, uterus and adnexa.  Musculoskeletal: Normal range of motion.  Neurological: She is alert and oriented to person, place, and time.  Skin: Skin is warm and dry.  Psychiatric: She has a normal mood and affect.   Endometrial biopsy: benign 11/18/17 Assessment/Plan: Menorrhagia Possible perimenopausal Hx anemia  Recommendation:  Options for management reviewed which include Birth control pills, Ibuprofen, Lysteda, LNIUS, endometrial ablation and  hysterectomy.  Risks and benefits of each reviewed and pt wants to proceed with endometrial ablation.  The risks of anesthesia, bleeding, infection and damage to adjacent organs reviewed.  Risk of uterine perforation reviewed.  Pt wants to proceed.  Maris Berger Tal Neer 11/25/2017, 10:40 AM

## 2017-11-25 ENCOUNTER — Ambulatory Visit (HOSPITAL_BASED_OUTPATIENT_CLINIC_OR_DEPARTMENT_OTHER)
Admission: RE | Admit: 2017-11-25 | Discharge: 2017-11-25 | Disposition: A | Discharge status: Home / Self Care | Payer: 59 | Source: Ambulatory Visit | Attending: Obstetrics and Gynecology | Admitting: Obstetrics and Gynecology

## 2017-11-25 ENCOUNTER — Other Ambulatory Visit: Payer: Self-pay

## 2017-11-25 ENCOUNTER — Ambulatory Visit (HOSPITAL_BASED_OUTPATIENT_CLINIC_OR_DEPARTMENT_OTHER): Payer: 59 | Admitting: Anesthesiology

## 2017-11-25 ENCOUNTER — Encounter (HOSPITAL_BASED_OUTPATIENT_CLINIC_OR_DEPARTMENT_OTHER)
Admission: RE | Disposition: A | Discharge status: Home / Self Care | Payer: Self-pay | Source: Ambulatory Visit | Attending: Obstetrics and Gynecology

## 2017-11-25 ENCOUNTER — Encounter (HOSPITAL_BASED_OUTPATIENT_CLINIC_OR_DEPARTMENT_OTHER): Payer: Self-pay

## 2017-11-25 ENCOUNTER — Telehealth: Payer: Self-pay | Admitting: Obstetrics and Gynecology

## 2017-11-25 DIAGNOSIS — Z87442 Personal history of urinary calculi: Secondary | ICD-10-CM | POA: Insufficient documentation

## 2017-11-25 DIAGNOSIS — Z881 Allergy status to other antibiotic agents status: Secondary | ICD-10-CM | POA: Insufficient documentation

## 2017-11-25 DIAGNOSIS — N926 Irregular menstruation, unspecified: Secondary | ICD-10-CM

## 2017-11-25 DIAGNOSIS — D500 Iron deficiency anemia secondary to blood loss (chronic): Secondary | ICD-10-CM

## 2017-11-25 DIAGNOSIS — N920 Excessive and frequent menstruation with regular cycle: Secondary | ICD-10-CM | POA: Diagnosis not present

## 2017-11-25 DIAGNOSIS — N921 Excessive and frequent menstruation with irregular cycle: Secondary | ICD-10-CM

## 2017-11-25 DIAGNOSIS — I10 Essential (primary) hypertension: Secondary | ICD-10-CM | POA: Diagnosis not present

## 2017-11-25 DIAGNOSIS — Z79899 Other long term (current) drug therapy: Secondary | ICD-10-CM | POA: Diagnosis not present

## 2017-11-25 HISTORY — DX: Nausea with vomiting, unspecified: R11.2

## 2017-11-25 HISTORY — DX: Cough: R05

## 2017-11-25 HISTORY — DX: Cough, unspecified: R05.9

## 2017-11-25 HISTORY — DX: Essential (primary) hypertension: I10

## 2017-11-25 HISTORY — DX: Personal history of urinary calculi: Z87.442

## 2017-11-25 HISTORY — DX: Anemia, unspecified: D64.9

## 2017-11-25 HISTORY — PX: DILITATION & CURRETTAGE/HYSTROSCOPY WITH HYDROTHERMAL ABLATION: SHX5570

## 2017-11-25 HISTORY — DX: Nausea with vomiting, unspecified: Z98.890

## 2017-11-25 LAB — POCT I-STAT 4, (NA,K, GLUC, HGB,HCT)
GLUCOSE: 84 mg/dL (ref 65–99)
HEMATOCRIT: 39 % (ref 36.0–46.0)
HEMOGLOBIN: 13.3 g/dL (ref 12.0–15.0)
POTASSIUM: 4.2 mmol/L (ref 3.5–5.1)
Sodium: 142 mmol/L (ref 135–145)

## 2017-11-25 LAB — POCT PREGNANCY, URINE: PREG TEST UR: NEGATIVE

## 2017-11-25 SURGERY — DILATATION & CURETTAGE/HYSTEROSCOPY WITH HYDROTHERMAL ABLATION
Anesthesia: General | Site: Vagina

## 2017-11-25 MED ORDER — LACTATED RINGERS IV SOLN
INTRAVENOUS | Status: DC
  Administered 2017-11-25 (×3): via INTRAVENOUS
  Filled 2017-11-25: qty 1000

## 2017-11-25 MED ORDER — HYDROMORPHONE HCL 1 MG/ML IJ SOLN
0.2500 mg | INTRAMUSCULAR | Status: DC | PRN
  Administered 2017-11-25 (×2): 0.5 mg via INTRAVENOUS
  Filled 2017-11-25: qty 0.5

## 2017-11-25 MED ORDER — ONDANSETRON HCL 4 MG/2ML IJ SOLN
4.0000 mg | Freq: Once | INTRAMUSCULAR | Status: AC | PRN
  Administered 2017-11-25: 4 mg via INTRAVENOUS
  Filled 2017-11-25: qty 2

## 2017-11-25 MED ORDER — SCOPOLAMINE 1 MG/3DAYS TD PT72
1.0000 | MEDICATED_PATCH | TRANSDERMAL | Status: DC
  Administered 2017-11-25: 1 via TRANSDERMAL
  Administered 2017-11-25: 1.5 mg via TRANSDERMAL
  Filled 2017-11-25: qty 1

## 2017-11-25 MED ORDER — LABETALOL HCL 5 MG/ML IV SOLN
10.0000 mg | INTRAVENOUS | Status: AC | PRN
  Administered 2017-11-25 (×2): 10 mg via INTRAVENOUS
  Filled 2017-11-25: qty 4

## 2017-11-25 MED ORDER — PROPOFOL 10 MG/ML IV BOLUS
INTRAVENOUS | Status: AC
  Filled 2017-11-25: qty 20

## 2017-11-25 MED ORDER — KETOROLAC TROMETHAMINE 30 MG/ML IJ SOLN
INTRAMUSCULAR | Status: AC
  Filled 2017-11-25: qty 1

## 2017-11-25 MED ORDER — IBUPROFEN 600 MG PO TABS: ORAL_TABLET | ORAL | 1 refills | Status: AC

## 2017-11-25 MED ORDER — ONDANSETRON HCL 4 MG/2ML IJ SOLN
INTRAMUSCULAR | Status: AC
  Filled 2017-11-25: qty 2

## 2017-11-25 MED ORDER — DEXAMETHASONE SODIUM PHOSPHATE 10 MG/ML IJ SOLN
INTRAMUSCULAR | Status: AC
  Filled 2017-11-25: qty 1

## 2017-11-25 MED ORDER — ONDANSETRON HCL 4 MG/2ML IJ SOLN
INTRAMUSCULAR | Status: AC
Start: 2017-11-25 — End: 2017-11-25
  Filled 2017-11-25: qty 2

## 2017-11-25 MED ORDER — DOXYCYCLINE HYCLATE 100 MG PO TABS: 100.0000 mg | ORAL_TABLET | Freq: Two times a day (BID) | ORAL | 0 refills | Status: AC

## 2017-11-25 MED ORDER — LIDOCAINE 2% (20 MG/ML) 5 ML SYRINGE
INTRAMUSCULAR | Status: AC
  Filled 2017-11-25: qty 5

## 2017-11-25 MED ORDER — MIDAZOLAM HCL 2 MG/2ML IJ SOLN
INTRAMUSCULAR | Status: AC
  Filled 2017-11-25: qty 2

## 2017-11-25 MED ORDER — PROMETHAZINE HCL 25 MG/ML IJ SOLN
6.2500 mg | Freq: Once | INTRAMUSCULAR | Status: AC
  Administered 2017-11-25: 6.25 mg via INTRAVENOUS
  Filled 2017-11-25: qty 1

## 2017-11-25 MED ORDER — HYDROMORPHONE HCL 1 MG/ML IJ SOLN
INTRAMUSCULAR | Status: AC
  Filled 2017-11-25: qty 1

## 2017-11-25 MED ORDER — DEXAMETHASONE SODIUM PHOSPHATE 4 MG/ML IJ SOLN
INTRAMUSCULAR | Status: DC | PRN
  Administered 2017-11-25: 10 mg via INTRAVENOUS

## 2017-11-25 MED ORDER — MIDAZOLAM HCL 2 MG/2ML IJ SOLN
INTRAMUSCULAR | Status: DC | PRN
  Administered 2017-11-25: 2 mg via INTRAVENOUS

## 2017-11-25 MED ORDER — OXYCODONE HCL 5 MG PO TABS
5.0000 mg | ORAL_TABLET | Freq: Once | ORAL | Status: AC
  Administered 2017-11-25: 5 mg via ORAL
  Filled 2017-11-25: qty 1

## 2017-11-25 MED ORDER — SODIUM CHLORIDE 0.9 % IR SOLN
Status: DC | PRN
  Administered 2017-11-25: 3000 mL

## 2017-11-25 MED ORDER — SILVER NITRATE-POT NITRATE 75-25 % EX MISC
CUTANEOUS | Status: DC | PRN
  Administered 2017-11-25: 1

## 2017-11-25 MED ORDER — ONDANSETRON HCL 4 MG/2ML IJ SOLN
INTRAMUSCULAR | Status: DC | PRN
  Administered 2017-11-25: 4 mg via INTRAVENOUS

## 2017-11-25 MED ORDER — FENTANYL CITRATE (PF) 250 MCG/5ML IJ SOLN
INTRAMUSCULAR | Status: AC
  Filled 2017-11-25: qty 5

## 2017-11-25 MED ORDER — KETOROLAC TROMETHAMINE 30 MG/ML IJ SOLN
INTRAMUSCULAR | Status: DC | PRN
  Administered 2017-11-25: 30 mg via INTRAMUSCULAR
  Administered 2017-11-25: 30 mg via INTRAVENOUS

## 2017-11-25 MED ORDER — LIDOCAINE 2% (20 MG/ML) 5 ML SYRINGE
INTRAMUSCULAR | Status: DC | PRN
  Administered 2017-11-25: 100 mg via INTRAVENOUS

## 2017-11-25 MED ORDER — MEPERIDINE HCL 25 MG/ML IJ SOLN
6.2500 mg | INTRAMUSCULAR | Status: DC | PRN
  Filled 2017-11-25: qty 1

## 2017-11-25 MED ORDER — LABETALOL HCL 5 MG/ML IV SOLN
INTRAVENOUS | Status: AC
  Filled 2017-11-25: qty 4

## 2017-11-25 MED ORDER — SCOPOLAMINE 1 MG/3DAYS TD PT72
MEDICATED_PATCH | TRANSDERMAL | Status: AC
  Filled 2017-11-25: qty 1

## 2017-11-25 MED ORDER — FENTANYL CITRATE (PF) 250 MCG/5ML IJ SOLN
INTRAMUSCULAR | Status: DC | PRN
  Administered 2017-11-25 (×3): 50 ug via INTRAVENOUS

## 2017-11-25 MED ORDER — PROPOFOL 10 MG/ML IV BOLUS
INTRAVENOUS | Status: DC | PRN
  Administered 2017-11-25: 200 mg via INTRAVENOUS

## 2017-11-25 MED ORDER — OXYCODONE HCL 5 MG PO TABS
ORAL_TABLET | ORAL | Status: AC
  Filled 2017-11-25: qty 1

## 2017-11-25 MED ORDER — OXYCODONE-ACETAMINOPHEN 5-325 MG PO TABS: 1.0000 | ORAL_TABLET | ORAL | 0 refills | Status: AC | PRN

## 2017-11-25 MED ORDER — EPHEDRINE 5 MG/ML INJ
INTRAVENOUS | Status: AC
Start: 2017-11-25 — End: 2017-11-25
  Filled 2017-11-25: qty 10

## 2017-11-25 MED ORDER — LIDOCAINE HCL 2 % IJ SOLN
INTRAMUSCULAR | Status: DC | PRN
  Administered 2017-11-25: 10 mL

## 2017-11-25 MED ORDER — PROMETHAZINE HCL 25 MG/ML IJ SOLN
INTRAMUSCULAR | Status: AC
  Filled 2017-11-25: qty 1

## 2017-11-25 SURGICAL SUPPLY — 25 items
ABLATOR ENDOMETRIAL BIPOLAR (ABLATOR) IMPLANT
BIPOLAR CUTTING LOOP 21FR (ELECTRODE)
BOOTIES KNEE HIGH SLOAN (MISCELLANEOUS) ×3 IMPLANT
CANISTER SUCT 3000ML PPV (MISCELLANEOUS) ×3 IMPLANT
CATH ROBINSON RED A/P 16FR (CATHETERS) ×3 IMPLANT
CLOTH BEACON ORANGE TIMEOUT ST (SAFETY) ×3 IMPLANT
COUNTER NEEDLE 1200 MAGNETIC (NEEDLE) ×3 IMPLANT
CURETTE PIPELLE ENDOMTRL SUCTN (MISCELLANEOUS) IMPLANT
DILATOR CANAL MILEX (MISCELLANEOUS) IMPLANT
ELECT REM PT RETURN 9FT ADLT (ELECTROSURGICAL) ×3
ELECTRODE REM PT RTRN 9FT ADLT (ELECTROSURGICAL) ×1 IMPLANT
GLOVE SURG SS PI 6.5 STRL IVOR (GLOVE) ×3 IMPLANT
GOWN STRL REUS W/TWL LRG LVL3 (GOWN DISPOSABLE) ×6 IMPLANT
IV NS IRRIG 3000ML ARTHROMATIC (IV SOLUTION) ×6 IMPLANT
KIT TURNOVER CYSTO (KITS) ×3 IMPLANT
LOOP CUTTING BIPOLAR 21FR (ELECTRODE) IMPLANT
PACK VAGINAL MINOR WOMEN LF (CUSTOM PROCEDURE TRAY) ×3 IMPLANT
PAD OB MATERNITY 4.3X12.25 (PERSONAL CARE ITEMS) ×3 IMPLANT
PIPELLE ENDOMETRIAL SUCTION CU (MISCELLANEOUS)
SET GENESYS HTA PROCERVA (MISCELLANEOUS) ×3 IMPLANT
SUT VIC AB 0 CT2 27 (SUTURE) IMPLANT
TOWEL OR 17X24 6PK STRL BLUE (TOWEL DISPOSABLE) ×6 IMPLANT
TUBING AQUILEX INFLOW (TUBING) ×3 IMPLANT
TUBING AQUILEX OUTFLOW (TUBING) ×3 IMPLANT
WATER STERILE IRR 500ML POUR (IV SOLUTION) ×3 IMPLANT

## 2017-11-25 NOTE — Anesthesia Preprocedure Evaluation (Addendum)
Anesthesia Evaluation  Patient identified by MRN, date of birth, ID band Patient awake    Reviewed: Allergy & Precautions, NPO status , Patient's Chart, lab work & pertinent test results  History of Anesthesia Complications (+) PONV  Airway Mallampati: I  TM Distance: >3 FB Neck ROM: Full    Dental  (+) Teeth Intact, Dental Advisory Given,    Pulmonary    Pulmonary exam normal        Cardiovascular Exercise Tolerance: Good hypertension, Pt. on medications Normal cardiovascular exam     Neuro/Psych    GI/Hepatic negative GI ROS, Neg liver ROS,   Endo/Other  negative endocrine ROS  Renal/GU negative Renal ROS     Musculoskeletal negative musculoskeletal ROS (+)   Abdominal   Peds  Hematology negative hematology ROS (+) anemia ,   Anesthesia Other Findings   Reproductive/Obstetrics                           Anesthesia Physical Anesthesia Plan  ASA: II  Anesthesia Plan: General   Post-op Pain Management:    Induction: Intravenous  PONV Risk Score and Plan: 4 or greater and Scopolamine patch - Pre-op, Dexamethasone, Ondansetron and Midazolam  Airway Management Planned: LMA  Additional Equipment:   Intra-op Plan:   Post-operative Plan: Extubation in OR  Informed Consent: I have reviewed the patients History and Physical, chart, labs and discussed the procedure including the risks, benefits and alternatives for the proposed anesthesia with the patient or authorized representative who has indicated his/her understanding and acceptance.     Plan Discussed with: CRNA and Surgeon  Anesthesia Plan Comments:         Anesthesia Quick Evaluation

## 2017-11-25 NOTE — Anesthesia Postprocedure Evaluation (Signed)
Anesthesia Post Note  Patient: Katherine IvanoffJessica Hart  Procedure(s) Performed: HYSTEROSCOPY WITH HYDROTHERMAL ABLATION (N/A Vagina )     Patient location during evaluation: PACU Anesthesia Type: General Level of consciousness: awake and alert Pain management: pain level controlled Vital Signs Assessment: post-procedure vital signs reviewed and stable Respiratory status: spontaneous breathing, nonlabored ventilation, respiratory function stable and patient connected to nasal cannula oxygen Cardiovascular status: blood pressure returned to baseline and stable Postop Assessment: no apparent nausea or vomiting Anesthetic complications: no    Last Vitals:  Vitals Value Taken Time  BP 173/99 11/25/2017 12:30 PM  Temp    Pulse 70 11/25/2017 12:32 PM  Resp 14 11/25/2017 12:32 PM  SpO2 99 % 11/25/2017 12:32 PM  Vitals shown include unvalidated device data.  Last Pain:  Vitals:   11/25/17 1215  TempSrc:   PainSc: 8                  Jaimi Belle DAVID

## 2017-11-25 NOTE — Anesthesia Procedure Notes (Signed)
Procedure Name: LMA Insertion Date/Time: 11/25/2017 11:00 AM Performed by: Earmon PhoenixWilkerson, Ceasia Elwell P, CRNA Pre-anesthesia Checklist: Patient identified, Emergency Drugs available, Suction available, Patient being monitored and Timeout performed Patient Re-evaluated:Patient Re-evaluated prior to induction Oxygen Delivery Method: Circle system utilized Preoxygenation: Pre-oxygenation with 100% oxygen Induction Type: IV induction Ventilation: Mask ventilation without difficulty LMA: LMA inserted LMA Size: 4.0 Placement Confirmation: positive ETCO2,  CO2 detector and breath sounds checked- equal and bilateral Tube secured with: Tape Dental Injury: Teeth and Oropharynx as per pre-operative assessment

## 2017-11-25 NOTE — Transfer of Care (Signed)
Immediate Anesthesia Transfer of Care Note  Patient: Katherine IvanoffJessica Mackowiak  Procedure(s) Performed: HYSTEROSCOPY WITH HYDROTHERMAL ABLATION (N/A Vagina )  Patient Location: PACU  Anesthesia Type:General  Level of Consciousness: awake  Airway & Oxygen Therapy: Patient Spontanous Breathing and Patient connected to nasal cannula oxygen  Post-op Assessment: Report given to RN and Post -op Vital signs reviewed and stable  Post vital signs: Reviewed and stable  Last Vitals:  Vitals Value Taken Time  BP 148/100 11/25/2017 11:56 AM  Temp 36.7 C 11/25/2017 11:56 AM  Pulse 84 11/25/2017 11:57 AM  Resp 12 11/25/2017 11:57 AM  SpO2 100 % 11/25/2017 11:57 AM  Vitals shown include unvalidated device data.  Last Pain:  Vitals:   11/25/17 0750  TempSrc:   PainSc: 0-No pain         Complications: No apparent anesthesia complications

## 2017-11-25 NOTE — Telephone Encounter (Signed)
Pt admits that she feels pretty comfortable now.  She has 3 ibuprofen 600mg  left over from her dental surgery.  I encouraged her to take her first dose now,. ( It was due at 430pm) and every 6 hours through the night and to get her medication first thing in the am when her pharmacy opens.  This is my usual regimen for post op endometrial ablation patients.  I feel she should do well without her Percocet as long as she takes her ibuprofen on time.  She agreed and will call if needed before the am.

## 2017-11-25 NOTE — Discharge Instructions (Signed)
DISCHARGE INSTRUCTIONS: The following instructions have been prepared to help you care for yourself upon your return home.   Personal hygiene:  Use sanitary pads for vaginal drainage, not tampons.  Shower the day after your procedure.  NO tub baths, pools or Jacuzzis for 2-3 weeks.  Wipe front to back after using the bathroom.  Activity and limitations:  Do NOT drive or operate any equipment for 24 hours. The effects of anesthesia are still present and drowsiness may result.  Do NOT rest in bed all day.  Walking is encouraged.  Walk up and down stairs slowly.  You may resume your normal activity in one to two days or as indicated by your physician.  Sexual activity: NO intercourse for at least 2 weeks after the procedure, or as indicated by your physician.  Diet: Eat a light meal as desired this evening. You may resume your usual diet tomorrow.  Return to work: You may resume your work activities in one to two days or as indicated by your doctor.  What to expect after your surgery: Expect to have vaginal bleeding/discharge for 2-3 days and spotting for up to 10 days. It is not unusual to have soreness for up to 1-2 weeks. You may have a slight burning sensation when you urinate for the first day. Mild cramps may continue for a couple of days. You may have a regular period in 2-6 weeks.  Call your doctor for any of the following:  Excessive vaginal bleeding, saturating and changing one pad every hour.  Inability to urinate 6 hours after discharge from hospital.  Pain not relieved by pain medication.  Fever of 100.4 F or greater.  Unusual vaginal discharge or odor.   Call for an appointment:    _____________    Post Anesthesia Home Care Instructions  Activity: Get plenty of rest for the remainder of the day. A responsible individual must stay with you for 24 hours following the procedure.  For the next 24 hours, DO NOT: -Drive a car -Advertising copywriterperate machinery -Drink  alcoholic beverages -Take any medication unless instructed by your physician -Make any legal decisions or sign important papers.   Meals: Start with liquid foods such as gelatin or soup. Progress to regular foods as tolerated. Avoid greasy, spicy, heavy foods. If nausea and/or vomiting occur, drink only clear liquids until the nausea and/or vomiting subsides. Call your physician if vomiting continues.  Special Instructions/Symptoms: Your throat may feel dry or sore from the anesthesia or the breathing tube placed in your throat during surgery. If this causes discomfort, gargle with warm salt water. The discomfort should disappear within 24 hours.  If you had a scopolamine patch placed behind your ear for the management of post- operative nausea and/or vomiting:  1. The medication in the patch is effective for 72 hours, after which it should be removed.  Wrap patch in a tissue and discard in the trash. Wash hands thoroughly with soap and water. 2. You may remove the patch earlier than 72 hours if you experience unpleasant side effects which may include dry mouth, dizziness or visual disturbances. 3. Avoid touching the patch. Wash your hands with soap and water after contact with the patch.   Call [facility] @ [phone] if:  You have a temperature greater than or equal to 100.4 degrees Farenheit orally You have pain that is not made better by the pain medication given and taken as directed You have excessive bleeding or problems urinating  Take Colace (  Docusate Sodium/Stool Softener) 100 mg 2-3 times daily while taking narcotic pain medicine to avoid constipation or until bowel movements are regular. Take Ibuprofen 600 mg with food every 6 hours for 5 days then as needed for pain  You may drive after 24 hours You may shower tomorrow  You may resume a regular diet   Avoid anything in vagina until after your post-operative visit)

## 2017-11-25 NOTE — Op Note (Signed)
Procedure(s): HYSTEROSCOPY WITH HYDROTHERMAL ABLATION Procedure Note  Katherine IvanoffJessica Hart female 47 y.o. 11/25/2017  Procedure(s) and Anesthesia Type:    * HYSTEROSCOPY WITH HYDROTHERMAL ABLATION - General  Surgeon(s) and Role:    * Nihal Marzella, Maris BergerVanessa P, MD - Primary   Indications: The patient was admitted to the hospital with a history of significant menorrhagia causing soiling of clothes and sheets.   Surgeon: Hal MoralesVanessa P Vondell Babers   Assistants: None  Anesthesia: General endotracheal anesthesia  ASA Class: 3    Procedure Detail  HYSTEROSCOPY WITH HYDROTHERMAL ABLATION  Findings: Uterus sounded to 9 cm.  The endometrial cavity contained no visible lesions.  Estimated Blood Loss:  Minimal         Drains: none  Blood Given: none          Specimens: None        Complications: Intermittent obstructed flow warnings due to tissue partially occluding the outflow orifice.  This did not compromise the ability to continue and complete the ablation         Disposition: PACU - hemodynamically stable.         Condition: stable    Procedure: The patient was taken to the operating room after appropriate identification placed on the operating table.  After the attainment of general anesthesia the placed patient was placed in the modified lithotomy position.  The perineum and vagina were prepped multiple layers of Betadine and the bladder emptied with a red Robinson catheter.  The perineum was draped as a sterile field.  A timeout was completed.  A Graves speculum was placed in the vagina and a paracervical block achieved with a total of 10 cc of 2% Xylocaine in the 5 and 7:00 positions.  A single-tooth tenaculum was placed on the anterior cervix.  The uterus sounded to 9 cm.  The cervix was dilated to accommodate the hydrothermal ablation hysteroscope.  Hysteroscopy was undertaken and no lesions were noted in the endometrial cavity.  The tubal ostia were identified.  Using saline as the  distention medium the healing phase of the procedure was undertaken.  Once this was completed the ablation phase was continued for 10 minutes.  There were several 1-2-second episodes of decrease in flow due to endometrial tissue partially occluding the outflow orifice.  The ablation was able to be continued throughout each of these very short interruptions for a total of 10 minutes.  2-minute cool down was undertaken and the post ablation hysteroscopy revealed that excellent coverage of the ablative changes was noted.  The instruments were removed from the vagina.  Patient was awakened from general anesthesia and taken to the recovery room in satisfactory condition having tolerated procedure well with sponge and instrument counts correct. The patient wore SCD hose throughout the procedure.

## 2017-11-26 ENCOUNTER — Encounter (HOSPITAL_BASED_OUTPATIENT_CLINIC_OR_DEPARTMENT_OTHER): Payer: Self-pay | Admitting: Obstetrics and Gynecology

## 2018-01-29 IMAGING — US US OB TRANSVAGINAL
1 series · 14 of 28 positions shown · non-contrast
Comparison: None.

CLINICAL DATA: 45 y/o F; abortion 2 weeks ago with increased
bleeding 1 day ago. Beta HCG of 80.6.

EXAM:
OBSTETRIC <14 WK ULTRASOUND
TECHNIQUE: Transabdominal ultrasound was performed for evaluation of the
gestation as well as the maternal uterus and adnexal regions.

[Series 1: us ob transvaginal · 0.23mm/px · 14 of 43 slices shown]
[im 2/43]
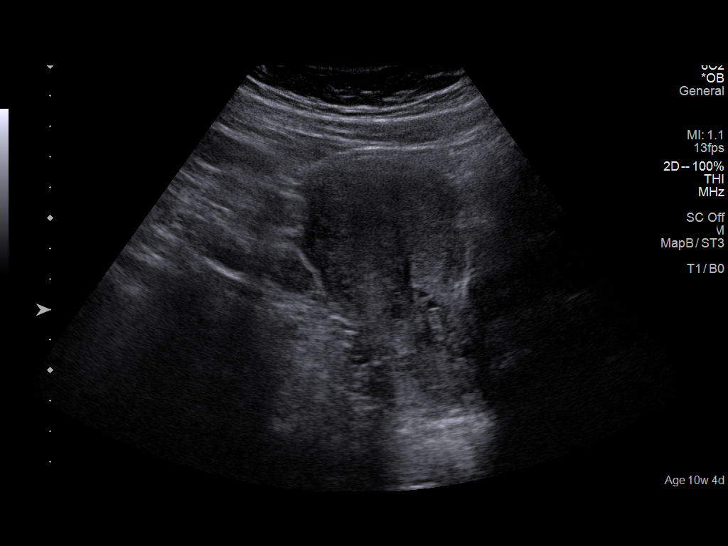
[im 5/43]
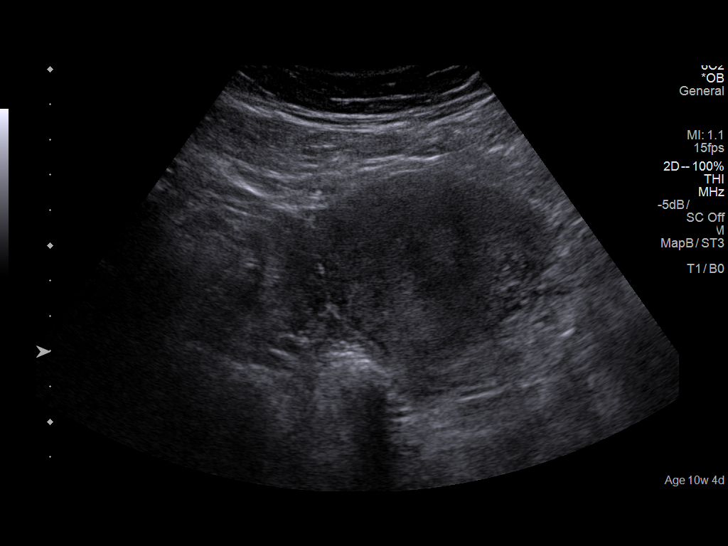
[im 8/43]
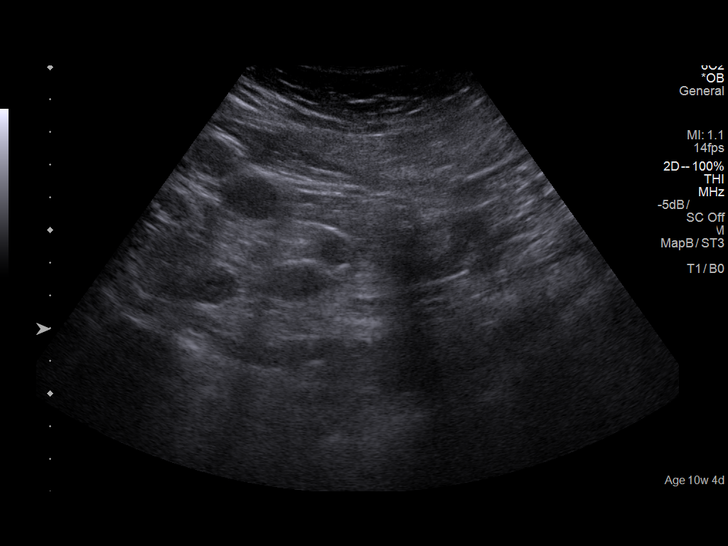
[im 11/43]
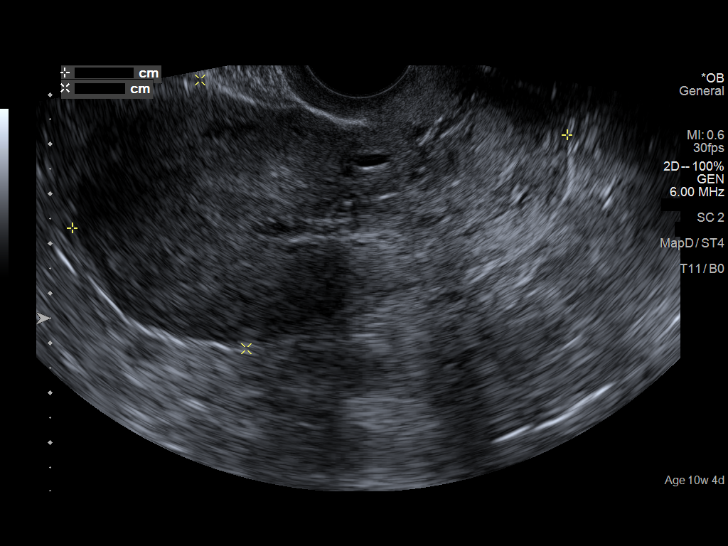
[im 15/43]
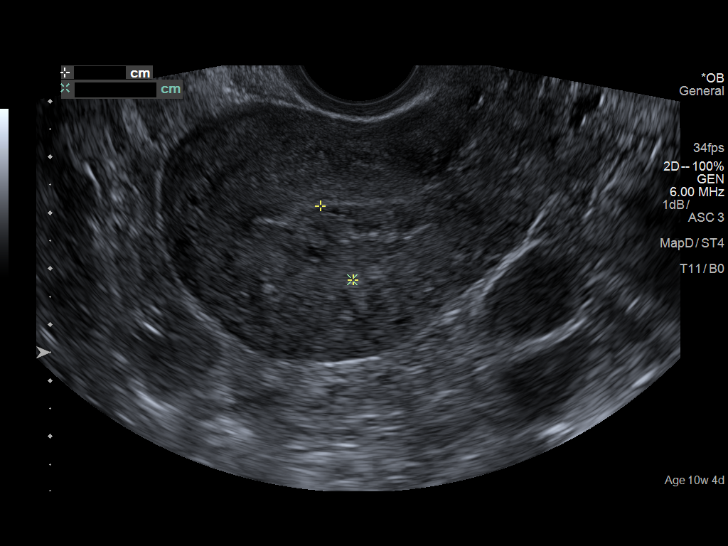
[im 18/43]
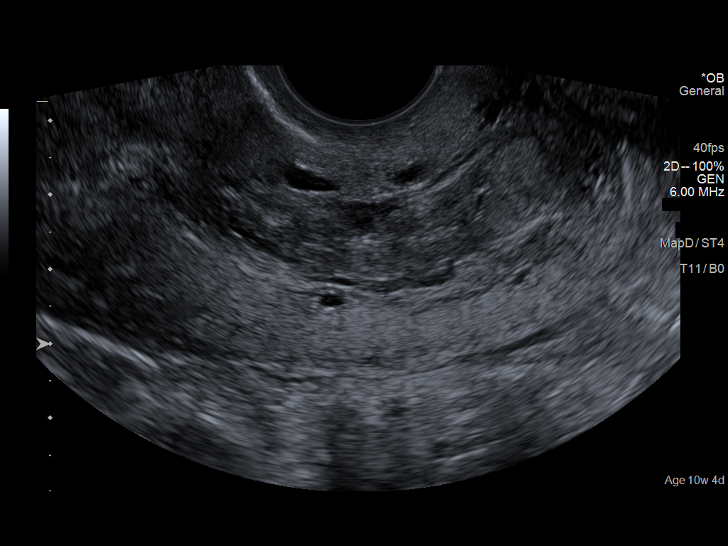
[im 21/43]
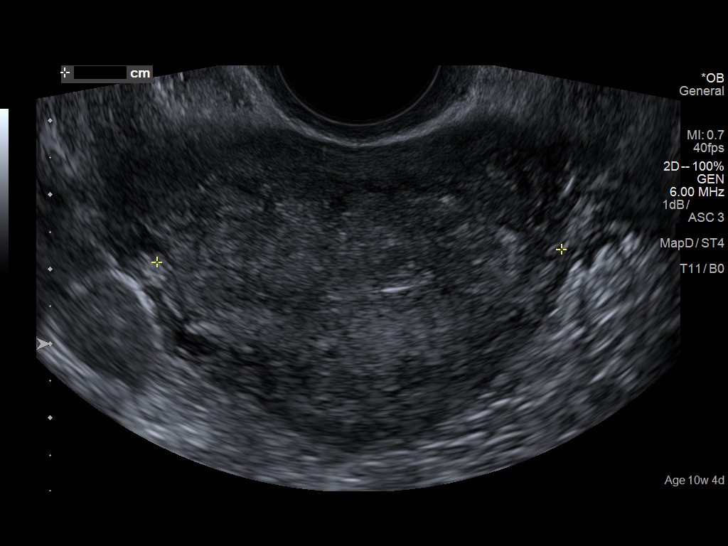
[im 24/43]
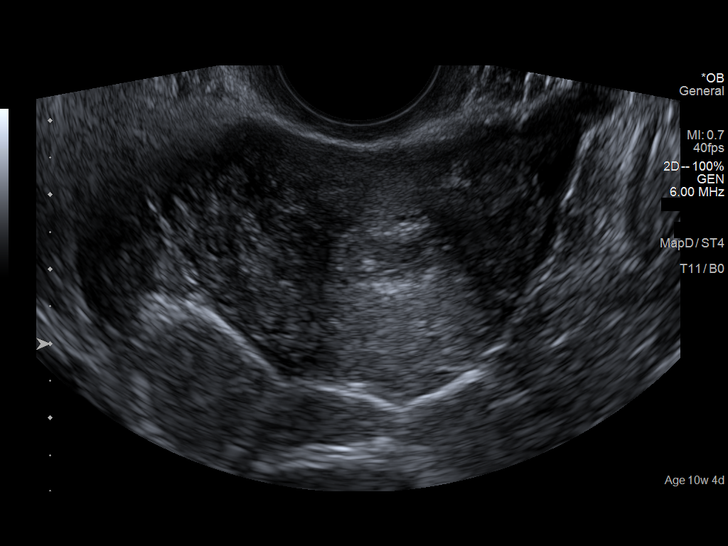
[im 27/43]
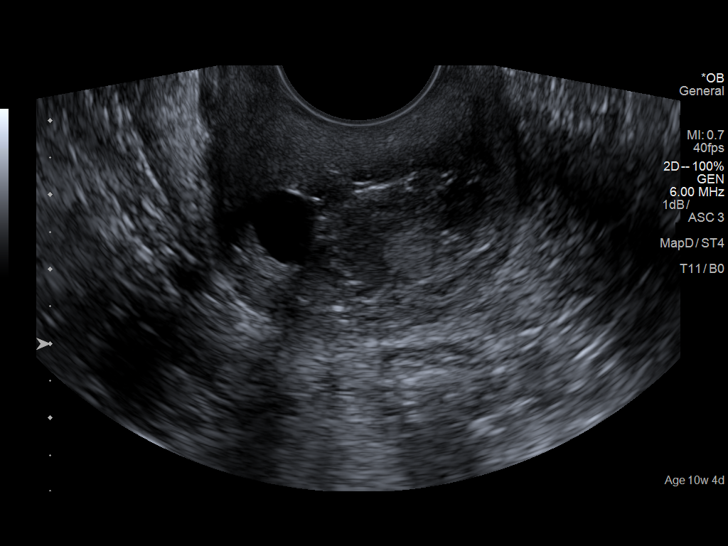
[im 30/43]
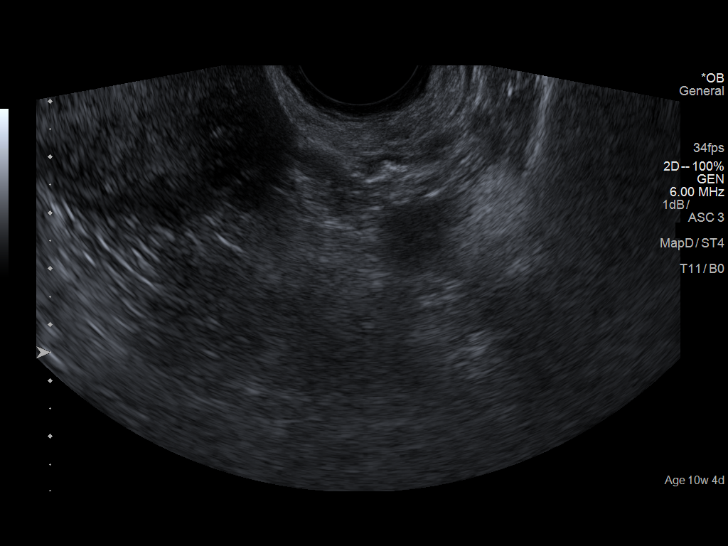
[im 33/43]
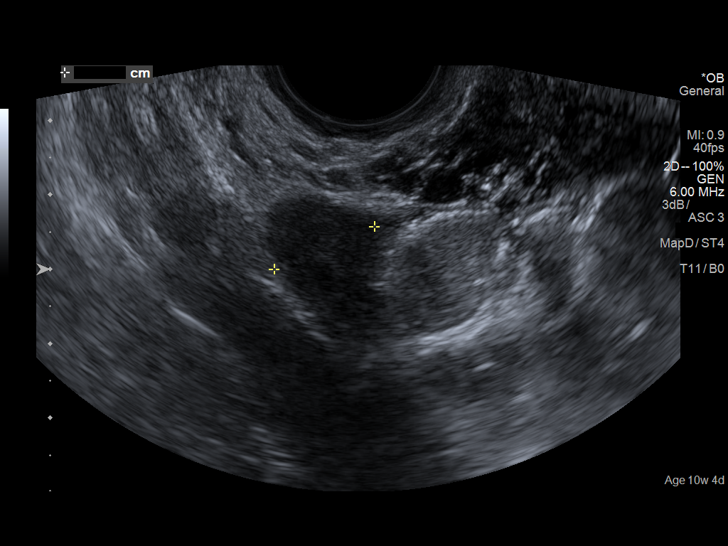
[im 36/43]
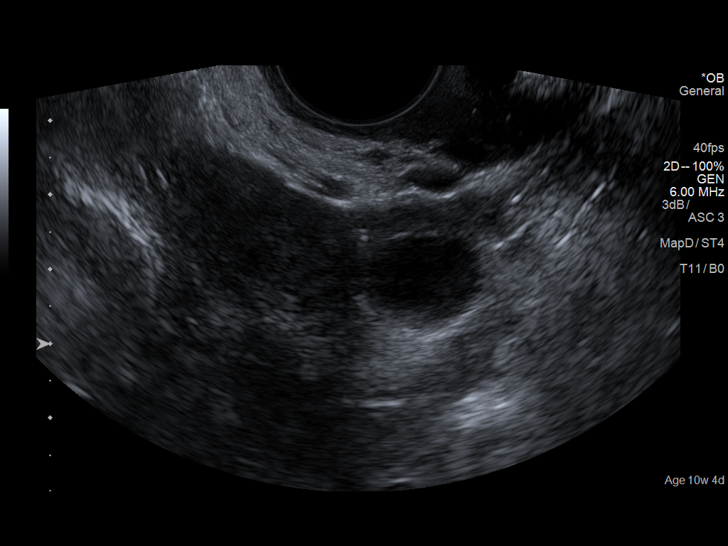
[im 39/43]
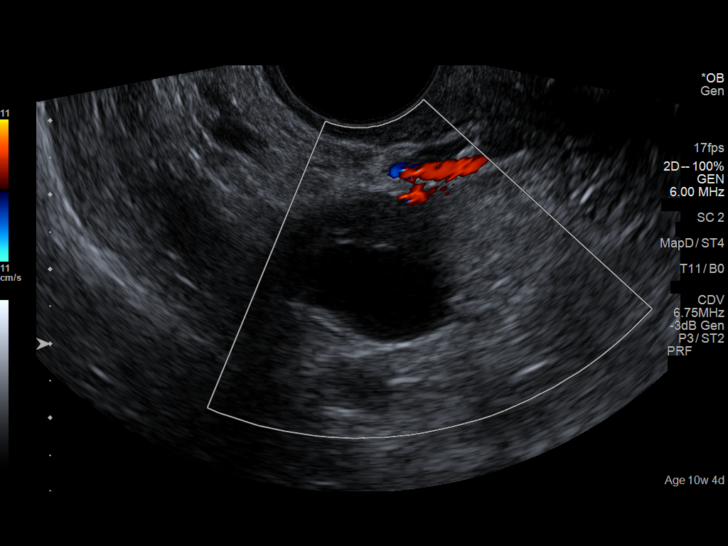
[im 43/43]
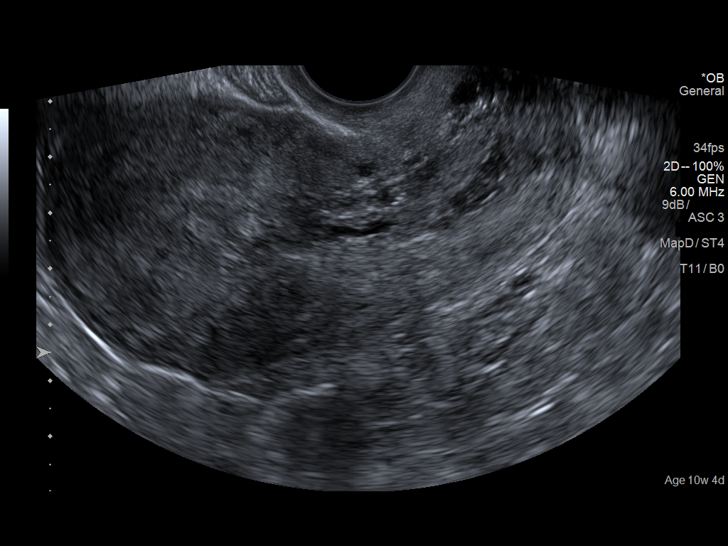

[14 of 28 positions shown; findings below may reference images not displayed]

FINDINGS: Intrauterine gestational sac: None

Yolk sac:  Not Visualized.

Embryo:  Not Visualized.

Cardiac Activity: Not Visualized.

Maternal uterus/adnexae: The uterus measures 10.1 x 5.5 x 5.4 cm and
is normal. The endometrium is mildly heterogeneous measuring up to
15 mm. There is no abnormal blood flow on color Doppler the suggests
retained products of conception or vascular malformation.

Normal right ovary with simple follicle measuring up to 1.8 cm. The
left ovary is not identified
IMPRESSION: 1. No intrauterine pregnancy is identified. Follow-up beta HCG and
pelvic ultrasound as clinically indicated is recommended.
2. Mild heterogeneity of the endometrium is probably due to recent
abortion. There is no abnormal blood flow on color Doppler to
suggest retained products of conception or vascular malformation.

By: Abimelk Tiger M.D.
